# Patient Record
Sex: Male | Born: 1985 | Race: White | Hispanic: No | Marital: Single | Smoking: Never smoker
Health system: Southern US, Community
[De-identification: ages and names within clinical notes are randomized; demographics above are authoritative.]

## PROBLEM LIST (undated history)

## (undated) DIAGNOSIS — K5792 Diverticulitis of intestine, part unspecified, without perforation or abscess without bleeding: Secondary | ICD-10-CM

## (undated) DIAGNOSIS — T7840XA Allergy, unspecified, initial encounter: Secondary | ICD-10-CM

## (undated) DIAGNOSIS — H5200 Hypermetropia, unspecified eye: Secondary | ICD-10-CM

## (undated) HISTORY — DX: Diverticulitis of intestine, part unspecified, without perforation or abscess without bleeding: K57.92

## (undated) HISTORY — DX: Hypermetropia, unspecified eye: H52.00

## (undated) HISTORY — PX: WISDOM TOOTH EXTRACTION: SHX21

## (undated) HISTORY — DX: Allergy, unspecified, initial encounter: T78.40XA

## (undated) HISTORY — PX: APPENDECTOMY: SHX54

---

## 2009-09-10 ENCOUNTER — Ambulatory Visit: Payer: Self-pay | Admitting: Family Medicine

## 2010-11-14 ENCOUNTER — Encounter: Payer: Self-pay | Admitting: Medical

## 2010-11-14 ENCOUNTER — Ambulatory Visit (INDEPENDENT_AMBULATORY_CARE_PROVIDER_SITE_OTHER): Payer: 59 | Admitting: Medical

## 2010-11-14 ENCOUNTER — Encounter: Payer: Self-pay | Admitting: Family Medicine

## 2010-11-14 DIAGNOSIS — Z0000 Encounter for general adult medical examination without abnormal findings: Secondary | ICD-10-CM

## 2010-11-14 DIAGNOSIS — E663 Overweight: Secondary | ICD-10-CM

## 2010-11-14 DIAGNOSIS — R7301 Impaired fasting glucose: Secondary | ICD-10-CM

## 2010-11-14 LAB — POCT GLYCOSYLATED HEMOGLOBIN (HGB A1C): Hemoglobin A1C: 5.5

## 2010-11-14 LAB — POCT URINALYSIS DIPSTICK
Ketones, UA: NEGATIVE
Leukocytes, UA: NEGATIVE
Urobilinogen, UA: NEGATIVE
pH, UA: 5

## 2010-11-14 NOTE — Patient Instructions (Signed)
Preventative Care for Adults, Male       REGULAR HEALTH EXAMS:  A routine yearly physical is a good way to check in with your primary care provider about your health and preventive screening. It is also an opportunity to share updates about your health and any concerns you have, and receive a thorough all-over exam.   Most health insurance companies pay for at least some preventative services.  Check with your health plan for specific coverages.  WHAT PREVENTATIVE SERVICES DO MEN NEED?  Adult men should have their weight and blood pressure checked regularly.   Men age 35 and older should have their cholesterol levels checked regularly.  Beginning at age 50 and continuing to age 75, men should be screened for colorectal cancer.  Certain people should may need continued testing until age 85.  Other cancer screening may include exams for testicular and prostate cancer.  Updating vaccinations is part of preventative care.  Vaccinations help protect against diseases such as the flu.  Lab tests are generally done as part of preventative care to screen for anemia and blood disorders, to screen for problems with the kidneys and liver, to screen for bladder problems, to check blood sugar, and to check your cholesterol level.  Preventative services generally include counseling about diet, exercise, avoiding tobacco, drugs, excessive alcohol consumption, and sexually transmitted infections.    GENERAL RECOMMENDATIONS FOR GOOD HEALTH:  Healthy diet:  Eat a variety of foods, including fruit, vegetables, animal or vegetable protein, such as meat, fish, chicken, and eggs, or beans, lentils, tofu, and grains, such as rice.  Drink plenty of water daily.  Decrease saturated fat in the diet, avoid lots of red meat, processed foods, sweets, fast foods, and fried foods.  Exercise:  Aerobic exercise helps maintain good heart health. At least 30-40 minutes of moderate-intensity exercise is recommended.  For example, a brisk walk that increases your heart rate and breathing. This should be done on most days of the week.   Find a type of exercise or a variety of exercises that you enjoy so that it becomes a part of your daily life.  Examples are running, walking, swimming, water aerobics, and biking.  For motivation and support, explore group exercise such as aerobic class, spin class, Zumba, Yoga,or  martial arts, etc.    Set exercise goals for yourself, such as a certain weight goal, walk or run in a race such as a 5k walk/run.  Speak to your primary care provider about exercise goals.  Disease prevention:  If you smoke or chew tobacco, find out from your caregiver how to quit. It can literally save your life, no matter how long you have been a tobacco user. If you do not use tobacco, never begin.   Maintain a healthy diet and normal weight. Increased weight leads to problems with blood pressure and diabetes.   The Body Mass Index or BMI is a way of measuring how much of your body is fat. Having a BMI above 27 increases the risk of heart disease, diabetes, hypertension, stroke and other problems related to obesity. Your caregiver can help determine your BMI and based on it develop an exercise and dietary program to help you achieve or maintain this important measurement at a healthful level.  High blood pressure causes heart and blood vessel problems.  Persistent high blood pressure should be treated with medicine if weight loss and exercise do not work.   Fat and cholesterol leaves deposits in your arteries   that can block them. This causes heart disease and vessel disease elsewhere in your body.  If your cholesterol is found to be high, or if you have heart disease or certain other medical conditions, then you may need to have your cholesterol monitored frequently and be treated with medication.   Ask if you should have a stress test if your history suggests this. A stress test is a test done on  a treadmill that looks for heart disease. This test can find disease prior to there being a problem.  Avoid drinking alcohol in excess (more than two drinks per day).  Avoid use of street drugs. Do not share needles with anyone. Ask for professional help if you need assistance or instructions on stopping the use of alcohol, cigarettes, and/or drugs.  Brush your teeth twice a day with fluoride toothpaste, and floss once a day. Good oral hygiene prevents tooth decay and gum disease. The problems can be painful, unattractive, and can cause other health problems. Visit your dentist for a routine oral and dental check up and preventive care every 6-12 months.   Look at your skin regularly.  Use a mirror to look at your back. Notify your caregivers of changes in moles, especially if there are changes in shapes, colors, a size larger than a pencil eraser, an irregular border, or development of new moles.  Safety:  Use seatbelts 100% of the time, whether driving or as a passenger.  Use safety devices such as hearing protection if you work in environments with loud noise or significant background noise.  Use safety glasses when doing any work that could send debris in to the eyes.  Use a helmet if you ride a bike or motorcycle.  Use appropriate safety gear for contact sports.  Talk to your caregiver about gun safety.  Use sunscreen with a SPF (or skin protection factor) of 15 or greater.  Lighter skinned people are at a greater risk of skin cancer. Don't forget to also wear sunglasses in order to protect your eyes from too much damaging sunlight. Damaging sunlight can accelerate cataract formation.   Practice safe sex. Use condoms. Condoms are used for birth control and to help reduce the spread of sexually transmitted infections (or STIs).  Some of the STIs are gonorrhea (the clap), chlamydia, syphilis, trichomonas, herpes, HPV (human papilloma virus) and HIV (human immunodeficiency virus) which causes AIDS.  The herpes, HIV and HPV are viral illnesses that have no cure. These can result in disability, cancer and death.   Keep carbon monoxide and smoke detectors in your home functioning at all times. Change the batteries every 6 months or use a model that plugs into the wall.   Vaccinations:  Stay up to date with your tetanus shots and other required immunizations. You should have a booster for tetanus every 10 years. Be sure to get your flu shot every year, since 5%-20% of the [place] population comes down with the flu. The flu vaccine changes each year, so being vaccinated once is not enough. Get your shot in the fall, before the flu season peaks.   Other vaccines to consider:  Pneumococcal vaccine to protect against certain types of pneumonia.  This is normally recommended for adults age 65 or older.  However, adults younger than 26 years old with certain underlying conditions such as diabetes, heart or lung disease should also receive the vaccine.  Shingles vaccine to protect against Varicella Zoster if you are older than age 60, or younger   than 26 years old with certain underlying illness.  Hepatitis A vaccine to protect against a form of infection of the liver by a virus acquired from food.  Hepatitis B vaccine to protect against a form of infection of the liver by a virus acquired from blood or body fluids, particularly if you work in health care.  If you plan to travel internationally, check with your local health department for specific vaccination recommendations.  Cancer Screening:  Most routine colon cancer screening begins at the age of 50. On a yearly basis, doctors may provide special easy to use take-home tests to check for hidden blood in the stool. Sigmoidoscopy or colonoscopy can detect the earliest forms of colon cancer and is life saving. These tests use a small camera at the end of a tube to directly examine the colon. Speak to your caregiver about this at age 50, when routine  screening begins (and is repeated every 5 years unless early forms of pre-cancerous polyps or small growths are found).   At the age of 50 men usually start screening for prostate cancer every year. Screening may begin at a younger age for those with higher risk. Those at higher risk include African-Americans or having a family history of prostate cancer. There are two types of tests for prostate cancer:   Prostate-specific antigen (PSA) testing. Recent studies raise questions about prostate cancer using PSA and you should discuss this with your caregiver.   Digital rectal exam (in which your doctor's lubricated and gloved finger feels for enlargement of the prostate through the anus).   Screening for testicular cancer.  Do a monthly exam of your testicles. Gently roll each testicle between your thumb and fingers, feeling for any abnormal lumps. The best time to do this is after a hot shower or bath when the tissues are looser. Notify your caregivers of any lumps, tenderness or changes in size or shape immediately.     

## 2010-11-14 NOTE — Progress Notes (Signed)
Subjective:   HPI  Dean Carter is a 25 y.o. male who presents for a complete physical.  Been doing well without c/o.    Reviewed their medical, surgical, family, social, medication, and allergy history and updated chart as appropriate.  Past Medical History  Diagnosis Date  . Allergy     RHINITIS  . Farsightedness     wears contacts    Past Surgical History  Procedure Date  . Appendectomy   . Wisdom tooth extraction     Family History  Problem Relation Age of Onset  . Diabetes Mother     TYPE 2  . Heart disease Paternal Uncle   . Stroke Maternal Grandmother   . Heart disease Paternal Grandfather     CABG  . Cancer Neg Hx     History   Social History  . Marital Status: Single    Spouse Name: N/A    Number of Children: N/A  . Years of Education: N/A   Occupational History  . Not on file.   Social History Main Topics  . Smoking status: Never Smoker   . Smokeless tobacco: Never Used  . Alcohol Use: 1.0 oz/week    2 drink(s) per week  . Drug Use: No  . Sexually Active: Not on file     environmental air systems, single, exercise - softball, run, stationary bike   Other Topics Concern  . Not on file   Social History Narrative  . No narrative on file    No current outpatient prescriptions on file prior to visit.    No Known Allergies   Review of Systems Constitutional: denies fever, chills, sweats, unexpected weight change, anorexia, fatigue Allergy: negative; denies recent sneezing, itching, congestion Dermatology: denies changing moles, rash, lumps, new worrisome lesions ENT: no runny nose, ear pain, sore throat, hoarseness, sinus pain, teeth pain, hearing loss, epistaxis Cardiology: denies chest pain, palpitations, edema, orthopnea, paroxysmal nocturnal dyspnea Respiratory: denies cough, shortness of breath, dyspnea on exertion, wheezing, hemoptysis Gastroenterology: denies abdominal pain, nausea, vomiting, diarrhea, constipation, blood in stool,  changes in bowel movement, dysphagia Hematology: denies bleeding or bruising problems Musculoskeletal: denies arthralgias, myalgias, joint swelling, back pain, neck pain, cramping, gait changes Ophthalmology: denies vision changes, eye redness, itching, discharge Urology: denies dysuria, difficulty urinating, hematuria, urinary frequency, urgency, incontinence Neurology: no headache, weakness, tingling, numbness, dizziness Psychology: denies depressed mood, agitation, sleep problems     Objective:   Physical Exam  Filed Vitals:   11/14/10 1539  BP: 124/88  Pulse: 72  Temp: 97.7 F (36.5 C)  Resp: 16    General appearance: alert, no distress, WD/WN, white male, overweight Skin: unremarkable, few scattered benign appearing macules HEENT: normocephalic, conjunctiva/corneas normal, sclerae anicteric, PERRLA, EOMi, nares patent, no discharge or erythema, pharynx normal Oral cavity: MMM, tongue normal, teeth normal Neck: supple, no lymphadenopathy, no thyromegaly, no masses, normal ROM, no bruits Chest: non tender, normal shape and expansion Heart: RRR, normal S1, S2, no murmurs Lungs: CTA bilaterally, no wheezes, rhonchi, or rales Abdomen: +bs, soft, non tender, non distended, no masses, no hepatomegaly, no splenomegaly, no bruits Back: non tender, normal ROM, no scoliosis Musculoskeletal: upper extremities non tender, no obvious deformity, normal ROM throughout, lower extremities non tender, no obvious deformity, normal ROM throughout Extremities: no edema, no cyanosis, no clubbing Pulses: 2+ symmetric, upper and lower extremities, normal cap refill Neurological: alert, oriented x 3, CN2-12 intact, strength normal upper extremities and lower extremities, sensation normal throughout, DTRs 2+ throughout, no cerebellar signs,  gait normal Psychiatric: normal affect, behavior normal, pleasant  GU: normal external male genitalia, circumcised, no nodules, mass, hernias  Assessment :      Encounter Diagnoses  Name Primary?  . General medical examination Yes  . Impaired fasting blood sugar   . Overweight      Plan:    Physical exam - discussed healthy lifestyle, diet, exercise, preventative care, vaccinations, and addressed their concerns.  Decline flu shot today.  He will return this week for fasting labs.   Impaired fasting glucose - glucose elevated last visit 7/11.  HgbA1C today  5.5%.     Overweight - discussed need for diet and exercise changes, weight loss.

## 2010-11-17 ENCOUNTER — Other Ambulatory Visit: Payer: 59

## 2010-11-17 LAB — COMPREHENSIVE METABOLIC PANEL
AST: 32 U/L (ref 0–37)
Albumin: 4.6 g/dL (ref 3.5–5.2)
Alkaline Phosphatase: 63 U/L (ref 39–117)
Potassium: 4.4 mEq/L (ref 3.5–5.3)
Sodium: 138 mEq/L (ref 135–145)
Total Bilirubin: 1.2 mg/dL (ref 0.3–1.2)
Total Protein: 6.7 g/dL (ref 6.0–8.3)

## 2010-11-17 LAB — CBC WITH DIFFERENTIAL/PLATELET
Basophils Relative: 1 % (ref 0–1)
Eosinophils Absolute: 0.2 10*3/uL (ref 0.0–0.7)
Eosinophils Relative: 2 % (ref 0–5)
Lymphs Abs: 1.7 10*3/uL (ref 0.7–4.0)
MCH: 29.5 pg (ref 26.0–34.0)
MCHC: 33.7 g/dL (ref 30.0–36.0)
MCV: 87.5 fL (ref 78.0–100.0)
Monocytes Relative: 10 % (ref 3–12)
Neutrophils Relative %: 62 % (ref 43–77)
Platelets: 258 10*3/uL (ref 150–400)
RBC: 5.36 MIL/uL (ref 4.22–5.81)

## 2010-11-17 LAB — LIPID PANEL
HDL: 41 mg/dL (ref >39)
LDL Cholesterol: 114 mg/dL — ABNORMAL HIGH (ref 0–99)
Total CHOL/HDL Ratio: 4.3 Ratio

## 2010-11-17 LAB — TSH: TSH: 1.165 u[IU]/mL (ref 0.350–4.500)

## 2010-11-18 ENCOUNTER — Telehealth: Payer: Self-pay | Admitting: *Deleted

## 2010-11-18 NOTE — Telephone Encounter (Addendum)
Message copied by Dorthula Perfect on Fri Nov 18, 2010 11:38 AM ------      Message from: Aleen Campi, DAVID S      Created: Fri Nov 18, 2010  7:52 AM       1 of his liver tests was slightly elevated, but ALL other tests were normal - normal blood count, kidney, lytes, thyroid, urine, and cholesterol.   Regarding the liver test, it is likely due to fatty liver which is usually related to diet, being overweight, and alcohol intake.  Recommend no more than 2 alcoholic drinks on a given day, and primarily make sure he is exercising most days of the week, trying to eat a healthy low fat diet.             Lets have him come in for nurse visit for weight check and liver panel in 56mo.   Pt notified of lab results.  Advised pt regarding diet, exercise and alcohol intake.  Pt scheduled to return on 03-18-10 at 8:15am.  CM, LPN

## 2010-12-21 ENCOUNTER — Ambulatory Visit (INDEPENDENT_AMBULATORY_CARE_PROVIDER_SITE_OTHER): Payer: 59 | Admitting: Medical

## 2010-12-21 ENCOUNTER — Encounter: Payer: Self-pay | Admitting: Medical

## 2010-12-21 VITALS — BP 120/88 | HR 80 | Temp 98.0°F | Resp 20 | Ht 73.0 in | Wt 259.0 lb

## 2010-12-21 DIAGNOSIS — H8309 Labyrinthitis, unspecified ear: Secondary | ICD-10-CM | POA: Insufficient documentation

## 2010-12-21 DIAGNOSIS — J069 Acute upper respiratory infection, unspecified: Secondary | ICD-10-CM

## 2010-12-21 NOTE — Patient Instructions (Signed)
Labyrinthitis (Inner Ear Inflammation) Your exam shows you have an inner ear disturbance or labyrinthitis. The cause of this condition is not known. But it may be due to a virus infection. The symptoms of labyrinthitis include vertigo or dizziness made worse by motion, nausea and vomiting. The onset of labyrinthitis may be very sudden. It usually lasts for a few days and then clears up over 1-2 weeks. The treatment of an inner ear disturbance includes bed rest and medications to reduce dizziness, nausea, and vomiting. You should stay away from alcohol, tranquilizers, caffeine, nicotine, or any medicine your doctor thinks may make your symptoms worse. Further testing may be needed to evaluate your hearing and balance system.  Please see your doctor or go to the emergency room right away if you have:  Increasing vertigo, earache, loss of hearing, or ear drainage.   Headache, blurred vision, trouble walking, fainting, or fever.   Persistent vomiting, dehydration, or extreme weakness.  Document Released: 02/20/2005 Document Re-Released: 12/24/2007 [facility] Patient Information 2011 [place], [place].

## 2010-12-21 NOTE — Progress Notes (Signed)
  Subjective:     Dean Carter is a 25 y.o. male who presents for evaluation of symptoms of a URI. Symptoms include nasal congestion, sore throat and vertigo. Onset of symptoms was 1 day ago, and has been gradually worsening since that time.  Yesterday had sore throat, but over night started getting fatigue, dizziness.  He notes similar prior history in college and he ultimately had ear infection at that time.  Treatment to date: began OTC Alka Seltzer Cough and Cold.  Denies sick contacts.  No other aggravating or relieving factors.  No other c/o.  The following portions of the patient's history were reviewed and updated as appropriate: allergies, current medications, past family history, past medical history, past social history, past surgical history and problem list.  Review of Systems Constitutional:  denies fever, chills, sweats, anorexia Skin: denies rash HEENT: +sore throat; denies ear pain, itchy watery eyes Cardiovascular: denies chest pain Lungs: denies wheezing, SOB Abdomen: +nausea; denies abdominal pain, vomiting, diarrhea GU: denies dysuria  Objective:   Filed Vitals:   12/21/10 0951  BP: 120/88  Pulse: 80  Temp: 98 F (36.7 C)  Resp: 20    General appearance: Alert, WD/WN, no distress, mildly ill appearing                             Skin: warm, no rash                           Head: no sinus tenderness                            Eyes: conjunctiva normal, corneas clear, PERRLA                            Ears: pearly TMs, external ear canals normal                          Nose: septum midline,right turbinate swollen, with erythema and clear discharge, left turbinate normal             Mouth/throat: MMM, tongue normal, mild pharyngeal erythema                           Neck: supple, no adenopathy, no thyromegaly, nontender                          Heart: RRR, normal S1, S2, no murmurs                         Lungs: CTA bilaterally, no wheezes, rales, or rhonchi    Assessment:     Encounter Diagnoses  Name Primary?  . Labyrinthitis, viral Yes  . URI (upper respiratory infection)      Plan:   Discussed diagnosis and treatment of URI/labyrinthitis.  Suggested symptomatic OTC remedies, c/t Alka Seltzer cold and cough. Nasal saline spray for congestion.  Tylenol or Ibuprofen OTC for fever and malaise.  Call/return in 2-3 days if symptoms aren't resolving.

## 2011-03-17 ENCOUNTER — Other Ambulatory Visit: Payer: 59

## 2011-03-17 VITALS — Wt 241.0 lb

## 2011-03-17 DIAGNOSIS — R748 Abnormal levels of other serum enzymes: Secondary | ICD-10-CM

## 2011-03-18 LAB — HEPATIC FUNCTION PANEL
Albumin: 5.1 g/dL (ref 3.5–5.2)
Bilirubin, Direct: 0.3 mg/dL (ref 0.0–0.3)
Total Bilirubin: 1.1 mg/dL (ref 0.3–1.2)

## 2011-09-27 ENCOUNTER — Ambulatory Visit (INDEPENDENT_AMBULATORY_CARE_PROVIDER_SITE_OTHER): Payer: 59 | Admitting: Medical

## 2011-09-27 ENCOUNTER — Encounter: Payer: Self-pay | Admitting: Medical

## 2011-09-27 VITALS — BP 110/70 | HR 80 | Temp 97.5°F | Resp 18 | Ht 74.0 in | Wt 253.0 lb

## 2011-09-27 DIAGNOSIS — E663 Overweight: Secondary | ICD-10-CM

## 2011-09-27 DIAGNOSIS — Z113 Encounter for screening for infections with a predominantly sexual mode of transmission: Secondary | ICD-10-CM

## 2011-09-27 DIAGNOSIS — Z0000 Encounter for general adult medical examination without abnormal findings: Secondary | ICD-10-CM

## 2011-09-27 LAB — COMPREHENSIVE METABOLIC PANEL
ALT: 52 U/L (ref 0–53)
Albumin: 4.6 g/dL (ref 3.5–5.2)
CO2: 29 mEq/L (ref 19–32)
Calcium: 9.4 mg/dL (ref 8.4–10.5)
Chloride: 102 mEq/L (ref 96–112)
Glucose, Bld: 79 mg/dL (ref 70–99)
Potassium: 4.4 mEq/L (ref 3.5–5.3)
Sodium: 140 mEq/L (ref 135–145)
Total Protein: 6.7 g/dL (ref 6.0–8.3)

## 2011-09-27 LAB — CBC
MCV: 88.1 fL (ref 78.0–100.0)
Platelets: 272 10*3/uL (ref 150–400)
RBC: 5.3 MIL/uL (ref 4.22–5.81)
RDW: 13.9 % (ref 11.5–15.5)
WBC: 8 10*3/uL (ref 4.0–10.5)

## 2011-09-27 LAB — LIPID PANEL
Cholesterol: 171 mg/dL (ref 0–200)
HDL: 45 mg/dL (ref >39)
Triglycerides: 111 mg/dL (ref <150)

## 2011-09-27 LAB — HEMOGLOBIN A1C: Hgb A1c MFr Bld: 5.5 % (ref <5.7)

## 2011-09-27 NOTE — Patient Instructions (Signed)
Preventative Care for Adults, Male       REGULAR HEALTH EXAMS:  A routine yearly physical is a good way to check in with your primary care provider about your health and preventive screening. It is also an opportunity to share updates about your health and any concerns you have, and receive a thorough all-over exam.   Most health insurance companies pay for at least some preventative services.  Check with your health plan for specific coverages.  WHAT PREVENTATIVE SERVICES DO MEN NEED?  Adult men should have their weight and blood pressure checked regularly.   Men age 35 and older should have their cholesterol levels checked regularly.  Beginning at age 50 and continuing to age 75, men should be screened for colorectal cancer.  Certain people should may need continued testing until age 85.  Other cancer screening may include exams for testicular and prostate cancer.  Updating vaccinations is part of preventative care.  Vaccinations help protect against diseases such as the flu.  Lab tests are generally done as part of preventative care to screen for anemia and blood disorders, to screen for problems with the kidneys and liver, to screen for bladder problems, to check blood sugar, and to check your cholesterol level.  Preventative services generally include counseling about diet, exercise, avoiding tobacco, drugs, excessive alcohol consumption, and sexually transmitted infections.    GENERAL RECOMMENDATIONS FOR GOOD HEALTH:  Healthy diet:  Eat a variety of foods, including fruit, vegetables, animal or vegetable protein, such as meat, fish, chicken, and eggs, or beans, lentils, tofu, and grains, such as rice.  Drink plenty of water daily.  Decrease saturated fat in the diet, avoid lots of red meat, processed foods, sweets, fast foods, and fried foods.  Exercise:  Aerobic exercise helps maintain good heart health. At least 30-40 minutes of moderate-intensity exercise is recommended.  For example, a brisk walk that increases your heart rate and breathing. This should be done on most days of the week.   Find a type of exercise or a variety of exercises that you enjoy so that it becomes a part of your daily life.  Examples are running, walking, swimming, water aerobics, and biking.  For motivation and support, explore group exercise such as aerobic class, spin class, Zumba, Yoga,or  martial arts, etc.    Set exercise goals for yourself, such as a certain weight goal, walk or run in a race such as a 5k walk/run.  Speak to your primary care provider about exercise goals.  Disease prevention:  If you smoke or chew tobacco, find out from your caregiver how to quit. It can literally save your life, no matter how long you have been a tobacco user. If you do not use tobacco, never begin.   Maintain a healthy diet and normal weight. Increased weight leads to problems with blood pressure and diabetes.   The Body Mass Index or BMI is a way of measuring how much of your body is fat. Having a BMI above 27 increases the risk of heart disease, diabetes, hypertension, stroke and other problems related to obesity. Your caregiver can help determine your BMI and based on it develop an exercise and dietary program to help you achieve or maintain this important measurement at a healthful level.  High blood pressure causes heart and blood vessel problems.  Persistent high blood pressure should be treated with medicine if weight loss and exercise do not work.   Fat and cholesterol leaves deposits in your arteries   that can block them. This causes heart disease and vessel disease elsewhere in your body.  If your cholesterol is found to be high, or if you have heart disease or certain other medical conditions, then you may need to have your cholesterol monitored frequently and be treated with medication.   Ask if you should have a stress test if your history suggests this. A stress test is a test done on  a treadmill that looks for heart disease. This test can find disease prior to there being a problem.  Avoid drinking alcohol in excess (more than two drinks per day).  Avoid use of street drugs. Do not share needles with anyone. Ask for professional help if you need assistance or instructions on stopping the use of alcohol, cigarettes, and/or drugs.  Brush your teeth twice a day with fluoride toothpaste, and floss once a day. Good oral hygiene prevents tooth decay and gum disease. The problems can be painful, unattractive, and can cause other health problems. Visit your dentist for a routine oral and dental check up and preventive care every 6-12 months.   Look at your skin regularly.  Use a mirror to look at your back. Notify your caregivers of changes in moles, especially if there are changes in shapes, colors, a size larger than a pencil eraser, an irregular border, or development of new moles.  Safety:  Use seatbelts 100% of the time, whether driving or as a passenger.  Use safety devices such as hearing protection if you work in environments with loud noise or significant background noise.  Use safety glasses when doing any work that could send debris in to the eyes.  Use a helmet if you ride a bike or motorcycle.  Use appropriate safety gear for contact sports.  Talk to your caregiver about gun safety.  Use sunscreen with a SPF (or skin protection factor) of 15 or greater.  Lighter skinned people are at a greater risk of skin cancer. Don't forget to also wear sunglasses in order to protect your eyes from too much damaging sunlight. Damaging sunlight can accelerate cataract formation.   Practice safe sex. Use condoms. Condoms are used for birth control and to help reduce the spread of sexually transmitted infections (or STIs).  Some of the STIs are gonorrhea (the clap), chlamydia, syphilis, trichomonas, herpes, HPV (human papilloma virus) and HIV (human immunodeficiency virus) which causes AIDS.  The herpes, HIV and HPV are viral illnesses that have no cure. These can result in disability, cancer and death.   Keep carbon monoxide and smoke detectors in your home functioning at all times. Change the batteries every 6 months or use a model that plugs into the wall.   Vaccinations:  Stay up to date with your tetanus shots and other required immunizations. You should have a booster for tetanus every 10 years. Be sure to get your flu shot every year, since 5%-20% of the [place] population comes down with the flu. The flu vaccine changes each year, so being vaccinated once is not enough. Get your shot in the fall, before the flu season peaks.   Other vaccines to consider:  Pneumococcal vaccine to protect against certain types of pneumonia.  This is normally recommended for adults age 65 or older.  However, adults younger than 27 years old with certain underlying conditions such as diabetes, heart or lung disease should also receive the vaccine.  Shingles vaccine to protect against Varicella Zoster if you are older than age 60, or younger   than 27 years old with certain underlying illness.  Hepatitis A vaccine to protect against a form of infection of the liver by a virus acquired from food.  Hepatitis B vaccine to protect against a form of infection of the liver by a virus acquired from blood or body fluids, particularly if you work in health care.  If you plan to travel internationally, check with your local health department for specific vaccination recommendations.  Cancer Screening:  Most routine colon cancer screening begins at the age of 50. On a yearly basis, doctors may provide special easy to use take-home tests to check for hidden blood in the stool. Sigmoidoscopy or colonoscopy can detect the earliest forms of colon cancer and is life saving. These tests use a small camera at the end of a tube to directly examine the colon. Speak to your caregiver about this at age 50, when routine  screening begins (and is repeated every 5 years unless early forms of pre-cancerous polyps or small growths are found).   At the age of 50 men usually start screening for prostate cancer every year. Screening may begin at a younger age for those with higher risk. Those at higher risk include African-Americans or having a family history of prostate cancer. There are two types of tests for prostate cancer:   Prostate-specific antigen (PSA) testing. Recent studies raise questions about prostate cancer using PSA and you should discuss this with your caregiver.   Digital rectal exam (in which your doctor's lubricated and gloved finger feels for enlargement of the prostate through the anus).   Screening for testicular cancer.  Do a monthly exam of your testicles. Gently roll each testicle between your thumb and fingers, feeling for any abnormal lumps. The best time to do this is after a hot shower or bath when the tissues are looser. Notify your caregivers of any lumps, tenderness or changes in size or shape immediately.     

## 2011-09-27 NOTE — Progress Notes (Signed)
Subjective:   HPI  Dean Carter is a 26 y.o. male who presents for a complete physical.  Been doing well without c/o.  Leaving to go to [place] soon on assignment.  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   Reviewed their medical, surgical, family, social, medication, and allergy history and updated chart as appropriate.  Past Medical History  Diagnosis Date  . Allergy     RHINITIS  . Farsightedness     wears contacts    Past Surgical History  Procedure Date  . Appendectomy   . Wisdom tooth extraction     Family History  Problem Relation Age of Onset  . Diabetes Mother     TYPE 2  . Heart disease Paternal Uncle     CABG  . Stroke Maternal Grandmother   . Heart disease Paternal Grandfather     CABG  . Cancer Neg Hx   . Hypertension Neg Hx   . Hyperlipidemia Neg Hx     History   Social History  . Marital Status: Single    Spouse Name: N/A    Number of Children: N/A  . Years of Education: N/A   Occupational History  . Emergency planning/management officer - Scientist, research (life sciences)   Social History Main Topics  . Smoking status: Never Smoker   . Smokeless tobacco: Never Used  . Alcohol Use: 1.0 oz/week    2 drink(s) per week  . Drug Use: No  . Sexually Active: Not on file     environmental air systems, single, exercise - softball, run, stationary bike   Other Topics Concern  . Not on file   Social History Narrative   Single, no children, exercise - tennis, softball, gym with spin    Current Outpatient Prescriptions on File Prior to Visit  Medication Sig Dispense Refill  . Ascorbic Acid (VITAMIN C) 1000 MG tablet Take 1,000 mg by mouth daily.        . fish oil-omega-3 fatty acids 1000 MG capsule Take 1 g by mouth daily.        Marland Kitchen loratadine (CLARITIN) 10 MG tablet Take 10 mg by mouth daily.      . vitamin B-12 (CYANOCOBALAMIN) 1000 MCG tablet Take 1,000 mcg by mouth daily.           No Known Allergies   Review of Systems Constitutional: denies fever, chills, sweats, unexpected weight change, anorexia, fatigue Allergy: negative; denies recent sneezing, itching, congestion Dermatology: denies changing moles, rash, lumps, new worrisome lesions ENT: no runny nose, ear pain, sore throat, hoarseness, sinus pain, teeth pain, hearing loss, epistaxis Cardiology: denies chest pain, palpitations, edema, orthopnea, paroxysmal nocturnal dyspnea Respiratory: denies cough, shortness of breath, dyspnea on exertion, wheezing, hemoptysis Gastroenterology: denies abdominal pain, nausea, vomiting, diarrhea, constipation, blood in stool, changes in bowel movement, dysphagia Hematology: denies bleeding or bruising problems Musculoskeletal: denies arthralgias, myalgias, joint swelling, back pain, neck pain, cramping, gait changes Ophthalmology: denies vision changes, eye redness, itching, discharge Urology: denies dysuria, difficulty urinating, hematuria, urinary frequency, urgency, incontinence Neurology: no headache, weakness, tingling, numbness, dizziness Psychology: denies depressed mood, agitation, sleep problems     Objective:   Physical Exam  Filed Vitals:   09/27/11 0856  BP: 110/70  Pulse: 80  Temp: 97.5 F (36.4 C)  Resp: 18    General appearance: alert, no distress, WD/WN, white male, overweight Skin: unremarkable, few scattered benign appearing macules HEENT: normocephalic, conjunctiva/corneas normal, sclerae  anicteric, PERRLA, EOMi, nares patent, no discharge or erythema, pharynx normal Oral cavity: MMM, tongue normal, teeth normal Neck: supple, no lymphadenopathy, no thyromegaly, no masses, normal ROM, no bruits Chest: non tender, normal shape and expansion Heart: RRR, normal S1, S2, no murmurs Lungs: CTA bilaterally, no wheezes, rhonchi, or rales Abdomen: +bs, soft, non tender, non distended, no masses, no hepatomegaly, no splenomegaly, no bruits Back: non  tender, normal ROM, no scoliosis Musculoskeletal: upper extremities non tender, no obvious deformity, normal ROM throughout, lower extremities non tender, no obvious deformity, normal ROM throughout Extremities: no edema, no cyanosis, no clubbing Pulses: 2+ symmetric, upper and lower extremities, normal cap refill Neurological: alert, oriented x 3, CN2-12 intact, strength normal upper extremities and lower extremities, sensation normal throughout, DTRs 2+ throughout, no cerebellar signs, gait normal Psychiatric: normal affect, behavior normal, pleasant  GU: normal external male genitalia, circumcised, no nodules, mass, hernias  Assessment :    Encounter Diagnoses  Name Primary?  . Routine general medical examination at a health care facility Yes  . Screen for STD (sexually transmitted disease)   . Overweight      Plan:    Physical exam - discussed healthy lifestyle, diet, exercise, preventative care, vaccinations, and addressed their concerns.  He will get copy of prior vaccines through college.   STD screening today, discussed safe sex.    Overweight - discussed need for diet and exercise changes, weight loss.

## 2011-09-28 LAB — RPR

## 2011-09-28 LAB — GC/CHLAMYDIA PROBE AMP, URINE: Chlamydia, Swab/Urine, PCR: NEGATIVE

## 2012-03-06 DIAGNOSIS — K5792 Diverticulitis of intestine, part unspecified, without perforation or abscess without bleeding: Secondary | ICD-10-CM

## 2012-03-06 HISTORY — DX: Diverticulitis of intestine, part unspecified, without perforation or abscess without bleeding: K57.92

## 2012-10-09 ENCOUNTER — Encounter: Payer: Self-pay | Admitting: Medical

## 2012-10-09 ENCOUNTER — Ambulatory Visit (INDEPENDENT_AMBULATORY_CARE_PROVIDER_SITE_OTHER): Payer: 59 | Admitting: Medical

## 2012-10-09 VITALS — BP 100/78 | HR 80 | Temp 97.4°F | Resp 16 | Wt 261.0 lb

## 2012-10-09 DIAGNOSIS — Z2089 Contact with and (suspected) exposure to other communicable diseases: Secondary | ICD-10-CM

## 2012-10-09 DIAGNOSIS — Z202 Contact with and (suspected) exposure to infections with a predominantly sexual mode of transmission: Secondary | ICD-10-CM

## 2012-10-09 DIAGNOSIS — Z113 Encounter for screening for infections with a predominantly sexual mode of transmission: Secondary | ICD-10-CM

## 2012-10-09 DIAGNOSIS — J069 Acute upper respiratory infection, unspecified: Secondary | ICD-10-CM

## 2012-10-09 NOTE — Patient Instructions (Signed)
Sexually Transmitted Disease  Sexually transmitted disease (STD) refers to any infection that is passed from person to person during sexual activity. This may happen by way of saliva, semen, blood, vaginal mucus, or urine. Common STDs include:   Gonorrhea.   Chlamydia.   Syphilis.   HIV/AIDS.   Genital herpes.   Hepatitis B and C.   Trichomonas.   Human papillomavirus (HPV).   Pubic lice.  CAUSES   An STD may be spread by bacteria, virus, or parasite. A person can get an STD by:   Sexual intercourse with an infected person.   Sharing sex toys with an infected person.   Sharing needles with an infected person.   Having intimate contact with the genitals, mouth, or rectal areas of an infected person.  SYMPTOMS   Some people may not have any symptoms, but they can still pass the infection to others. Different STDs have different symptoms. Symptoms include:   Painful or bloody urination.   Pain in the pelvis, abdomen, vagina, anus, throat, or eyes.   Skin rash, itching, irritation, growths, or sores (lesions). These usually occur in the genital or anal area.   Abnormal vaginal discharge.   Penile discharge in men.   Soft, flesh-colored skin growths in the genital or anal area.   Fever.   Pain or bleeding during sexual intercourse.   Swollen glands in the groin area.   Yellow skin and eyes (jaundice). This is seen with hepatitis.  DIAGNOSIS   To make a diagnosis, your caregiver may:   Take a medical history.   Perform a physical exam.   Take a specimen (culture) to be examined.   Examine a sample of discharge under a microscope.   Perform blood tests.   Perform a Pap test, if this applies.   Perform a colposcopy.   Perform a laparoscopy.  TREATMENT    Chlamydia, gonorrhea, trichomonas, and syphilis can be cured with antibiotic medicine.   Genital herpes, hepatitis, and HIV can be treated, but not cured, with prescribed medicines. The medicines will lessen the symptoms.   Genital warts  from HPV can be treated with medicine or by freezing, burning (electrocautery), or surgery. Warts may come back.   HPV is a virus and cannot be cured with medicine or surgery.However, abnormal areas may be followed very closely by your caregiver and may be removed from the cervix, vagina, or vulva through office procedures or surgery.  If your diagnosis is confirmed, your recent sexual partners need treatment. This is true even if they are symptom-free or have a negative culture or evaluation. They should not have sex until their caregiver says it is okay.  HOME CARE INSTRUCTIONS   All sexual partners should be informed, tested, and treated for all STDs.   Take your antibiotics as directed. Finish them even if you start to feel better.   Only take over-the-counter or prescription medicines for pain, discomfort, or fever as directed by your caregiver.   Rest.   Eat a balanced diet and drink enough fluids to keep your urine clear or pale yellow.   Do not have sex until treatment is completed and you have followed up with your caregiver. STDs should be checked after treatment.   Keep all follow-up appointments, Pap tests, and blood tests as directed by your caregiver.   Only use latex condoms and water-soluble lubricants during sexual activity. Do not use petroleum jelly or oils.   Avoid alcohol and illegal drugs.   Get vaccinated   for HPV and hepatitis. If you have not received these vaccines in the past, talk to your caregiver about whether one or both might be right for you.   Avoid risky sex practices that can break the skin.  The only way to avoid getting an STD is to avoid all sexual activity.Latex condoms and dental dams (for oral sex) will help lessen the risk of getting an STD, but will not completely eliminate the risk.  SEEK MEDICAL CARE IF:    You have a fever.   You have any new or worsening symptoms.  Document Released: 05/13/2002 Document Revised: 05/15/2011 Document Reviewed:  05/20/2010  [employer] Patient Information 2014 [employer]

## 2012-10-09 NOTE — Progress Notes (Signed)
Subjective: Here for concern for STD screening.  Single.  In the past year since last STD check has had 3 sexual partners.   Using condoms some.   He denies any symptoms.  Denies hx/o IV drugs use, no hx/o blood transfusion.   Has some sinus congestion, nose stopped up an runny, some throat congestion, some cough.   Started Monday.   Not using anything for the symptoms.  Usually uses Mucinex.    No other c/o.    ROS as in subjective  Objective: Filed Vitals:   10/09/12 0812  BP: 100/78  Pulse: 80  Temp: 97.4 F (36.3 C)  Resp: 16    General appearance: alert, no distress, WD/WN HEENT: normocephalic, sclerae anicteric, TMs pearly, nares patent, no discharge or erythema, pharynx normal Oral cavity: MMM, no lesions Neck: supple, no lymphadenopathy, no thyromegaly, no masses Heart: RRR, normal S1, S2, no murmurs Lungs: CTA bilaterally, no wheezes, rhonchi, or rales GU: normal male external genitalia, circumcised, no lesions, no rash, no mass, no lymphadenopathy   Assessment: Encounter Diagnoses  Name Primary?  Marland Kitchen Upper respiratory infection Yes  . Screen for sexually transmitted diseases   . Venereal disease contact      Plan: Discussed supportive care for URI, call or return if worse or not improving.   STD screening today, discussed safe sex, prevention.   Handout given.  Follow-up pending labs.

## 2012-10-10 LAB — HEPATITIS C ANTIBODY: HCV Ab: NEGATIVE

## 2012-10-10 LAB — RPR

## 2012-10-10 LAB — HEPATITIS B CORE ANTIBODY, IGM: Hep B C IgM: NEGATIVE

## 2012-10-10 LAB — HEPATITIS B SURFACE ANTIGEN: Hepatitis B Surface Ag: NEGATIVE

## 2012-10-24 ENCOUNTER — Telehealth: Payer: Self-pay | Admitting: Medical

## 2012-10-24 NOTE — Telephone Encounter (Signed)
There is no record of his immunizations in the chart so we need to get his immunization record and see if he has had a TDaP within 10 years. If not then he can come in sooner for that

## 2012-10-25 NOTE — Telephone Encounter (Signed)
Pt said he was going to call at the begin of week to schedule appointment for nurse visit for t-dap

## 2012-11-01 ENCOUNTER — Other Ambulatory Visit (INDEPENDENT_AMBULATORY_CARE_PROVIDER_SITE_OTHER): Payer: 59

## 2012-11-01 DIAGNOSIS — Z23 Encounter for immunization: Secondary | ICD-10-CM

## 2012-11-08 ENCOUNTER — Encounter: Payer: Self-pay | Admitting: Medical

## 2012-12-06 ENCOUNTER — Encounter: Payer: Self-pay | Admitting: Medical

## 2012-12-06 ENCOUNTER — Ambulatory Visit (INDEPENDENT_AMBULATORY_CARE_PROVIDER_SITE_OTHER): Payer: 59 | Admitting: Medical

## 2012-12-06 VITALS — BP 102/78 | HR 72 | Temp 98.0°F | Resp 16 | Ht 73.5 in | Wt 242.0 lb

## 2012-12-06 DIAGNOSIS — Z0000 Encounter for general adult medical examination without abnormal findings: Secondary | ICD-10-CM

## 2012-12-06 DIAGNOSIS — E669 Obesity, unspecified: Secondary | ICD-10-CM

## 2012-12-06 DIAGNOSIS — Z833 Family history of diabetes mellitus: Secondary | ICD-10-CM

## 2012-12-06 DIAGNOSIS — Z23 Encounter for immunization: Secondary | ICD-10-CM

## 2012-12-06 LAB — POCT URINALYSIS DIPSTICK
Blood, UA: NEGATIVE
Protein, UA: NEGATIVE
Spec Grav, UA: 1.02
Urobilinogen, UA: NEGATIVE

## 2012-12-06 LAB — COMPREHENSIVE METABOLIC PANEL
ALT: 22 U/L (ref 0–53)
AST: 22 U/L (ref 0–37)
Albumin: 4.6 g/dL (ref 3.5–5.2)
Calcium: 9.8 mg/dL (ref 8.4–10.5)
Chloride: 101 mEq/L (ref 96–112)
Potassium: 3.9 mEq/L (ref 3.5–5.3)

## 2012-12-06 LAB — LIPID PANEL
LDL Cholesterol: 82 mg/dL (ref 0–99)
VLDL: 12 mg/dL (ref 0–40)

## 2012-12-06 LAB — CBC
HCT: 46.1 % (ref 39.0–52.0)
MCHC: 34.5 g/dL (ref 30.0–36.0)
RDW: 13.8 % (ref 11.5–15.5)

## 2012-12-06 LAB — HEMOGLOBIN A1C: Mean Plasma Glucose: 108 mg/dL (ref <117)

## 2012-12-06 NOTE — Patient Instructions (Signed)
Preventative Care for Adults, Male       REGULAR HEALTH EXAMS:  A routine yearly physical is a good way to check in with your primary care provider about your health and preventive screening. It is also an opportunity to share updates about your health and any concerns you have, and receive a thorough all-over exam.   Most health insurance companies pay for at least some preventative services.  Check with your health plan for specific coverages.  WHAT PREVENTATIVE SERVICES DO MEN NEED?  Adult men should have their weight and blood pressure checked regularly.   Men age 35 and older should have their cholesterol levels checked regularly.  Beginning at age 50 and continuing to age 75, men should be screened for colorectal cancer.  Certain people should may need continued testing until age 85.  Other cancer screening may include exams for testicular and prostate cancer.  Updating vaccinations is part of preventative care.  Vaccinations help protect against diseases such as the flu.  Lab tests are generally done as part of preventative care to screen for anemia and blood disorders, to screen for problems with the kidneys and liver, to screen for bladder problems, to check blood sugar, and to check your cholesterol level.  Preventative services generally include counseling about diet, exercise, avoiding tobacco, drugs, excessive alcohol consumption, and sexually transmitted infections.    GENERAL RECOMMENDATIONS FOR GOOD HEALTH:  Healthy diet:  Eat a variety of foods, including fruit, vegetables, animal or vegetable protein, such as meat, fish, chicken, and eggs, or beans, lentils, tofu, and grains, such as rice.  Drink plenty of water daily.  Decrease saturated fat in the diet, avoid lots of red meat, processed foods, sweets, fast foods, and fried foods.  Exercise:  Aerobic exercise helps maintain good heart health. At least 30-40 minutes of moderate-intensity exercise is recommended.  For example, a brisk walk that increases your heart rate and breathing. This should be done on most days of the week.   Find a type of exercise or a variety of exercises that you enjoy so that it becomes a part of your daily life.  Examples are running, walking, swimming, water aerobics, and biking.  For motivation and support, explore group exercise such as aerobic class, spin class, Zumba, Yoga,or  martial arts, etc.    Set exercise goals for yourself, such as a certain weight goal, walk or run in a race such as a 5k walk/run.  Speak to your primary care provider about exercise goals.  Disease prevention:  If you smoke or chew tobacco, find out from your caregiver how to quit. It can literally save your life, no matter how long you have been a tobacco user. If you do not use tobacco, never begin.   Maintain a healthy diet and normal weight. Increased weight leads to problems with blood pressure and diabetes.   The Body Mass Index or BMI is a way of measuring how much of your body is fat. Having a BMI above 27 increases the risk of heart disease, diabetes, hypertension, stroke and other problems related to obesity. Your caregiver can help determine your BMI and based on it develop an exercise and dietary program to help you achieve or maintain this important measurement at a healthful level.  High blood pressure causes heart and blood vessel problems.  Persistent high blood pressure should be treated with medicine if weight loss and exercise do not work.   Fat and cholesterol leaves deposits in your arteries   that can block them. This causes heart disease and vessel disease elsewhere in your body.  If your cholesterol is found to be high, or if you have heart disease or certain other medical conditions, then you may need to have your cholesterol monitored frequently and be treated with medication.   Ask if you should have a stress test if your history suggests this. A stress test is a test done on  a treadmill that looks for heart disease. This test can find disease prior to there being a problem.  Avoid drinking alcohol in excess (more than two drinks per day).  Avoid use of street drugs. Do not share needles with anyone. Ask for professional help if you need assistance or instructions on stopping the use of alcohol, cigarettes, and/or drugs.  Brush your teeth twice a day with fluoride toothpaste, and floss once a day. Good oral hygiene prevents tooth decay and gum disease. The problems can be painful, unattractive, and can cause other health problems. Visit your dentist for a routine oral and dental check up and preventive care every 6-12 months.   Look at your skin regularly.  Use a mirror to look at your back. Notify your caregivers of changes in moles, especially if there are changes in shapes, colors, a size larger than a pencil eraser, an irregular border, or development of new moles.  Safety:  Use seatbelts 100% of the time, whether driving or as a passenger.  Use safety devices such as hearing protection if you work in environments with loud noise or significant background noise.  Use safety glasses when doing any work that could send debris in to the eyes.  Use a helmet if you ride a bike or motorcycle.  Use appropriate safety gear for contact sports.  Talk to your caregiver about gun safety.  Use sunscreen with a SPF (or skin protection factor) of 15 or greater.  Lighter skinned people are at a greater risk of skin cancer. Don't forget to also wear sunglasses in order to protect your eyes from too much damaging sunlight. Damaging sunlight can accelerate cataract formation.   Practice safe sex. Use condoms. Condoms are used for birth control and to help reduce the spread of sexually transmitted infections (or STIs).  Some of the STIs are gonorrhea (the clap), chlamydia, syphilis, trichomonas, herpes, HPV (human papilloma virus) and HIV (human immunodeficiency virus) which causes AIDS.  The herpes, HIV and HPV are viral illnesses that have no cure. These can result in disability, cancer and death.   Keep carbon monoxide and smoke detectors in your home functioning at all times. Change the batteries every 6 months or use a model that plugs into the wall.   Vaccinations:  Stay up to date with your tetanus shots and other required immunizations. You should have a booster for tetanus every 10 years. Be sure to get your flu shot every year, since 5%-20% of the [place] population comes down with the flu. The flu vaccine changes each year, so being vaccinated once is not enough. Get your shot in the fall, before the flu season peaks.   Other vaccines to consider:  Pneumococcal vaccine to protect against certain types of pneumonia.  This is normally recommended for adults age 65 or older.  However, adults younger than 28 years old with certain underlying conditions such as diabetes, heart or lung disease should also receive the vaccine.  Shingles vaccine to protect against Varicella Zoster if you are older than age 60, or younger   than 28 years old with certain underlying illness.  Hepatitis A vaccine to protect against a form of infection of the liver by a virus acquired from food.  Hepatitis B vaccine to protect against a form of infection of the liver by a virus acquired from blood or body fluids, particularly if you work in health care.  If you plan to travel internationally, check with your local health department for specific vaccination recommendations.  Cancer Screening:  Most routine colon cancer screening begins at the age of 50. On a yearly basis, doctors may provide special easy to use take-home tests to check for hidden blood in the stool. Sigmoidoscopy or colonoscopy can detect the earliest forms of colon cancer and is life saving. These tests use a small camera at the end of a tube to directly examine the colon. Speak to your caregiver about this at age 50, when routine  screening begins (and is repeated every 5 years unless early forms of pre-cancerous polyps or small growths are found).   At the age of 50 men usually start screening for prostate cancer every year. Screening may begin at a younger age for those with higher risk. Those at higher risk include African-Americans or having a family history of prostate cancer. There are two types of tests for prostate cancer:   Prostate-specific antigen (PSA) testing. Recent studies raise questions about prostate cancer using PSA and you should discuss this with your caregiver.   Digital rectal exam (in which your doctor's lubricated and gloved finger feels for enlargement of the prostate through the anus).   Screening for testicular cancer.  Do a monthly exam of your testicles. Gently roll each testicle between your thumb and fingers, feeling for any abnormal lumps. The best time to do this is after a hot shower or bath when the tissues are looser. Notify your caregivers of any lumps, tenderness or changes in size or shape immediately.     

## 2012-12-06 NOTE — Progress Notes (Signed)
Subjective:   HPI  Dean Carter is a 27 y.o. male who presents for a complete physical. Been doing well in general. No specific complaint today. He is about 10 pounds lighter than a year ago on last physical. He has been more aggressively eating healthy and exercising regularly.   Preventative care: Last ophthalmology visit:yes- Dr. Laural Benes last eye exam 11/29/2012 Last dental visit:yes- Dr. Verner Chol Last colonoscopy:n/a Last prostate exam: n/a Last EKG:n/a Last labs:2013  Prior vaccinations: TD or Tdap:10/2012 Influenza: Today Pneumococcal:n/a Shingles/Zostavax:n/a  Advanced directive:n/a Health care power of attorney:n/a Living will:n/a  Concerns: None  Reviewed their medical, surgical, family, social, medication, and allergy history and updated chart as appropriate.  Past Medical History  Diagnosis Date  . Allergy     RHINITIS  . Farsightedness     wears contacts  . Diverticulitis 03/2012    Past Surgical History  Procedure Laterality Date  . Appendectomy    . Wisdom tooth extraction      History   Social History  . Marital Status: Single    Spouse Name: N/A    Number of Children: N/A  . Years of Education: N/A   Occupational History  . Emergency planning/management officer - Scientist, research (life sciences)   Social History Main Topics  . Smoking status: Never Smoker   . Smokeless tobacco: Never Used  . Alcohol Use: 1.0 oz/week    2 drink(s) per week  . Drug Use: No  . 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 Not on file   Social History Narrative   Single, no children, exercise - tennis, softball, gym with spin.  Holiday representative - IT trainer.     Family History  Problem Relation Age of Onset  . Diabetes Mother     TYPE 2  . Heart disease Paternal Uncle     CABG  . Stroke Maternal Grandmother   . Heart disease Paternal  Grandfather     CABG  . Cancer Neg Hx   . Hypertension Neg Hx   . Hyperlipidemia Neg Hx     Current outpatient prescriptions:loratadine (CLARITIN) 10 MG tablet, Take 10 mg by mouth daily., Disp: , Rfl: ;  Multiple Vitamin (MULTIVITAMIN) capsule, Take 1 capsule by mouth daily., Disp: , Rfl: ;  vitamin B-12 (CYANOCOBALAMIN) 1000 MCG tablet, Take 1,000 mcg by mouth daily.  , Disp: , Rfl:   No Known Allergies     Review of Systems Constitutional: -fever, -chills, -sweats, -unexpected weight change, -decreased appetite, -fatigue Allergy: -sneezing, -itching, -congestion Dermatology: -changing moles, --rash, -lumps ENT: -runny nose, -ear pain, -sore throat, -hoarseness, -sinus pain, -teeth pain, - ringing in ears, -hearing loss, -nosebleeds Cardiology: -chest pain, -palpitations, -swelling, -difficulty breathing when lying flat, -waking up short of breath Respiratory: -cough, -shortness of breath, -difficulty breathing with exercise or exertion, -wheezing, -coughing up blood Gastroenterology: -abdominal pain, -nausea, -vomiting, -diarrhea, -constipation, -blood in stool, -changes in bowel movement, -difficulty swallowing or eating Hematology: -bleeding, -bruising  Musculoskeletal: -joint aches, -muscle aches, -joint swelling, -back pain, -neck pain, -cramping, -changes in gait Ophthalmology: denies vision changes, eye redness, itching, discharge Urology: -burning with urination, -difficulty urinating, -blood in urine, -urinary frequency, -urgency, -incontinence Neurology: -headache, -weakness, -tingling, -numbness, -memory loss, -falls, -dizziness Psychology: -depressed mood, -agitation, -sleep problems     Objective:   Physical Exam  BP 102/78  Pulse 72  Temp(Src) 98 F (  36.7 C) (Oral)  Resp 16  Ht 6' 1.5" (1.867 m)  Wt 242 lb (109.77 kg)  BMI 31.49 kg/m2  General appearance: alert, no distress, WD/WN, white male, overweight  Skin: unremarkable, few scattered benign appearing  macules, son tattoo upper back, 2 notable macules include right lower back with 8 mm x 4 mm flat brown benign-appearing macule triangular-shaped, right low back with 8 mm x 3 mm round flat brown macule HEENT: normocephalic, conjunctiva/corneas normal, sclerae anicteric, PERRLA, EOMi, nares patent, no discharge or erythema, pharynx normal  Oral cavity: MMM, tongue normal, teeth normal  Neck: supple, no lymphadenopathy, no thyromegaly, no masses, normal ROM, no bruits  Chest: non tender, normal shape and expansion  Heart: RRR, normal S1, S2, no murmurs  Lungs: CTA bilaterally, no wheezes, rhonchi, or rales  Abdomen: +bs, soft, non tender, non distended, no masses, no hepatomegaly, no splenomegaly, no bruits  Back: non tender, normal ROM, no scoliosis  Musculoskeletal: upper extremities non tender, no obvious deformity, normal ROM throughout, lower extremities non tender, no obvious deformity, normal ROM throughout  Extremities: no edema, no cyanosis, no clubbing  Pulses: 2+ symmetric, upper and lower extremities, normal cap refill  Neurological: alert, oriented x 3, CN2-12 intact, strength normal upper extremities and lower extremities, sensation normal throughout, DTRs 2+ throughout, no cerebellar signs, gait normal  Psychiatric: normal affect, behavior normal, pleasant  GU: normal external male genitalia, circumcised, no nodules, mass, hernias    Assessment and Plan :      Encounter Diagnoses  Name Primary?  . Routine general medical examination at a health care facility Yes  . Family history of diabetes mellitus type II   . Need for prophylactic vaccination and inoculation against influenza   . Obesity, unspecified     Physical exam - discussed healthy lifestyle, diet, exercise, preventative care, vaccinations, and addressed their concerns.  Advised he continue efforts to lose weight through exercise and healthy diet.  Go to visit dentist and eye doctor yearly. Routine fasting labs at  his request today.  Counseled on safe sex, alcohol use, seatbelt use, not testing while driving.  Counseled on the influenza virus vaccine.  Vaccine information sheet given.  Influenza vaccine given after consent obtained.  Follow-up pending labs

## 2012-12-09 ENCOUNTER — Other Ambulatory Visit: Payer: Self-pay | Admitting: Medical

## 2012-12-09 DIAGNOSIS — IMO0002 Reserved for concepts with insufficient information to code with codable children: Secondary | ICD-10-CM

## 2012-12-09 DIAGNOSIS — R7989 Other specified abnormal findings of blood chemistry: Secondary | ICD-10-CM

## 2012-12-10 ENCOUNTER — Encounter: Payer: Self-pay | Admitting: Family Medicine

## 2013-05-20 ENCOUNTER — Ambulatory Visit: Payer: 59 | Admitting: Medical

## 2013-05-21 ENCOUNTER — Encounter: Payer: Self-pay | Admitting: Medical

## 2013-05-21 ENCOUNTER — Ambulatory Visit (INDEPENDENT_AMBULATORY_CARE_PROVIDER_SITE_OTHER): Payer: 59 | Admitting: Medical

## 2013-05-21 VITALS — BP 108/78 | HR 80 | Temp 97.7°F | Wt 255.0 lb

## 2013-05-21 DIAGNOSIS — W57XXXA Bitten or stung by nonvenomous insect and other nonvenomous arthropods, initial encounter: Secondary | ICD-10-CM

## 2013-05-21 DIAGNOSIS — R21 Rash and other nonspecific skin eruption: Secondary | ICD-10-CM

## 2013-05-21 DIAGNOSIS — T148 Other injury of unspecified body region: Secondary | ICD-10-CM

## 2013-05-21 MED ORDER — PERMETHRIN 5 % EX CREA: 1.0000 "application " | TOPICAL_CREAM | Freq: Once | CUTANEOUS | Status: DC

## 2013-05-21 MED ORDER — HYDROXYZINE HCL 10 MG PO TABS: ORAL_TABLET | ORAL | Status: DC

## 2013-05-21 MED ORDER — TRIAMCINOLONE ACETONIDE 0.1 % EX CREA: 1.0000 "application " | TOPICAL_CREAM | Freq: Two times a day (BID) | CUTANEOUS | Status: DC

## 2013-05-21 NOTE — Progress Notes (Signed)
   Subjective:   Dean DickerRobert Carter is a 28 y.o. male presenting on 05/21/2013 with Rash and Pruritis  Lately whole body seems to be itching.  Has seen some bites.  Looking on Internet about this.  Got lice shampoo OTC, shaved the pubic region, and use the lotion last week.  Still itching all over, has some raised bumps on the scrotum.  He wonders about scabies.  Using nothing in the last few days.  Has been hiking as well.  No other aggravating or relieving factors.  No other complaint.  Review of Systems ROS as in subjective      Objective:     Filed Vitals:   05/21/13 0902  BP: 108/78  Pulse: 80  Temp: 97.7 F (36.5 C)    General appearance: alert, no distress, WD/WN Skin:  Scrotum with 5 raised pink/red inflamed somewhat urticarial vs bite approach, areas of excoriations on lower abdomen, but no other obvious bite marks, no other sores, wounds, or hand or feet lesions suggestive of scabies or other process      Assessment: Encounter Diagnoses  Name Primary?  . Rash and nonspecific skin eruption Yes  . Insect bite      Plan: Rash less likely scabies, but more likely chigger bites.   Begin Hydroxyzine oral x 5-7 days, Triamcinolone cream topically, and if desired just in case can do round of Permethrin cream.  Call if not resolving.  Dean Carter was seen today for rash and pruritis.  Diagnoses and associated orders for this visit:  Rash and nonspecific skin eruption  Insect bite  Other Orders - hydrOXYzine (ATARAX/VISTARIL) 10 MG tablet; 1 tablet TID for itching and rash, can double up dose at night time - triamcinolone cream (KENALOG) 0.1 %; Apply 1 application topically 2 (two) times daily. - permethrin (ELIMITE) 5 % cream; Apply 1 application topically once.    Return if symptoms worsen or fail to improve.

## 2013-05-21 NOTE — Patient Instructions (Signed)
Scabies  Scabies are small bugs (mites) that burrow under the skin and cause red bumps and severe itching. These bugs can only be seen with a microscope. Scabies are highly contagious. They can spread easily from person to person by direct contact. They are also spread through sharing clothing or linens that have the scabies mites living in them. It is not unusual for an entire family to become infected through shared towels, clothing, or bedding.   HOME CARE INSTRUCTIONS   · Your caregiver may prescribe a cream or lotion to kill the mites. If cream is prescribed, massage the cream into the entire body from the neck to the bottom of both feet. Also massage the cream into the scalp and face if your child is less than 1 year old. Avoid the eyes and mouth. Do not wash your hands after application.  · Leave the cream on for 8 to 12 hours. Your child should bathe or shower after the 8 to 12 hour application period. Sometimes it is helpful to apply the cream to your child right before bedtime.  · One treatment is usually effective and will eliminate approximately 95% of infestations. For severe cases, your caregiver may decide to repeat the treatment in 1 week. Everyone in your household should be treated with one application of the cream.  · New rashes or burrows should not appear within 24 to 48 hours after successful treatment. However, the itching and rash may last for 2 to 4 weeks after successful treatment. Your caregiver may prescribe a medicine to help with the itching or to help the rash go away more quickly.  · Scabies can live on clothing or linens for up to 3 days. All of your child's recently used clothing, towels, stuffed toys, and bed linens should be washed in hot water and then dried in a dryer for at least 20 minutes on high heat. Items that cannot be washed should be enclosed in a plastic bag for at least 3 days.  · To help relieve itching, bathe your child in a cool bath or apply cool washcloths to the  affected areas.  · Your child may return to school after treatment with the prescribed cream.  SEEK MEDICAL CARE IF:   · The itching persists longer than 4 weeks after treatment.  · The rash spreads or becomes infected. Signs of infection include red blisters or yellow-tan crust.  Document Released: 02/20/2005 Document Revised: 05/15/2011 Document Reviewed: 07/01/2008  [employer]® Patient Information ©2014 [employer].

## 2013-06-11 ENCOUNTER — Telehealth: Payer: Self-pay | Admitting: Medical

## 2013-06-11 NOTE — Telephone Encounter (Signed)
I SPOKE WITH THIS PATIENT AND THERE SEEM TO BE A MIX UP ABOUT URINE RESULTS. CLS

## 2013-06-11 NOTE — Telephone Encounter (Signed)
If you are referring to the August 2014 labs, Karren BurlyChandra did call back, but go over results again

## 2013-06-13 ENCOUNTER — Ambulatory Visit: Payer: 59 | Admitting: Medical

## 2013-06-16 ENCOUNTER — Ambulatory Visit (INDEPENDENT_AMBULATORY_CARE_PROVIDER_SITE_OTHER): Payer: 59 | Admitting: Medical

## 2013-06-16 VITALS — BP 112/70 | HR 72 | Temp 98.3°F | Resp 14 | Wt 253.0 lb

## 2013-06-16 DIAGNOSIS — Z202 Contact with and (suspected) exposure to infections with a predominantly sexual mode of transmission: Secondary | ICD-10-CM

## 2013-06-16 DIAGNOSIS — R21 Rash and other nonspecific skin eruption: Secondary | ICD-10-CM

## 2013-06-16 MED ORDER — DOXYCYCLINE HYCLATE 100 MG PO TABS: 100.0000 mg | ORAL_TABLET | Freq: Two times a day (BID) | ORAL | Status: DC

## 2013-06-16 NOTE — Progress Notes (Signed)
  Subjective:   Dean DickerRobert Carter is a 28 y.o. male presenting on 06/16/2013 with No chief complaint on file.  Here for recheck on rash.  I saw him in March for same. although there has been some improvement, still has the original bumps on the scrotum, generalized itching, and few small red bumps on left abdomen and between 2 fingers.   Since last visit used the Permethrin cream x 2 rounds, washed all bed sheets and clothes.  Still having same symptoms.  Used the Hydroxyzine with some relief.  Wants STD screening repeated given >1 sexual partner since last visit.  No other aggravating or relieving factors.  No other complaint.  Review of Systems ROS as in subjective      Objective:     Filed Vitals:   06/16/13 0815  BP: 112/70  Pulse: 72  Temp: 98.3 F (36.8 C)  Resp: 14    General appearance: alert, no distress, WD/WN Skin:  Scrotum with 5 raised pink/red inflamed somewhat urticarial, few areas of excoriations on lower abdomen, few small excoriated lesions between few fingers both hands, but no other obvious bite marks, no other sores, wounds, or hand or feet lesions suggestive of scabies or other process       Assessment: Encounter Diagnoses  Name Primary?  . Rash and nonspecific skin eruption Yes  . Venereal disease contact      Plan:: Labs today, c/t Hydroxyzine, if RPR negative, will use a round of Doxycycline   If not improving in 2 wk, consider dermatology referral.  Dean Carter was seen today for no specified reason.  Diagnoses and associated orders for this visit:  Rash and nonspecific skin eruption - doxycycline (VIBRA-TABS) 100 MG tablet; Take 1 tablet (100 mg total) by mouth 2 (two) times daily. - RPR - HIV antibody - GC/Chlamydia Probe Amp  Venereal disease contact - doxycycline (VIBRA-TABS) 100 MG tablet; Take 1 tablet (100 mg total) by mouth 2 (two) times daily. - RPR - HIV antibody - GC/Chlamydia Probe Amp    Return pending labs.

## 2013-06-17 LAB — GC/CHLAMYDIA PROBE AMP
CT PROBE, AMP APTIMA: NEGATIVE
GC PROBE AMP APTIMA: NEGATIVE

## 2013-06-17 LAB — HIV ANTIBODY (ROUTINE TESTING W REFLEX): HIV 1&2 Ab, 4th Generation: NONREACTIVE

## 2013-06-17 LAB — RPR

## 2013-07-24 ENCOUNTER — Encounter: Payer: Self-pay | Admitting: Medical

## 2013-07-24 ENCOUNTER — Ambulatory Visit (INDEPENDENT_AMBULATORY_CARE_PROVIDER_SITE_OTHER): Payer: 59 | Admitting: Medical

## 2013-07-24 VITALS — BP 114/78 | HR 80 | Temp 97.8°F | Resp 16 | Wt 250.0 lb

## 2013-07-24 DIAGNOSIS — Z8719 Personal history of other diseases of the digestive system: Secondary | ICD-10-CM

## 2013-07-24 DIAGNOSIS — R109 Unspecified abdominal pain: Secondary | ICD-10-CM

## 2013-07-24 LAB — POCT URINALYSIS DIPSTICK
Bilirubin, UA: NEGATIVE
Blood, UA: NEGATIVE
Glucose, UA: NEGATIVE
Ketones, UA: NEGATIVE
Leukocytes, UA: NEGATIVE
NITRITE UA: NEGATIVE
PH UA: 6
PROTEIN UA: NEGATIVE
SPEC GRAV UA: 1.015
UROBILINOGEN UA: NEGATIVE

## 2013-07-24 MED ORDER — AMOXICILLIN-POT CLAVULANATE ER 1000-62.5 MG PO TB12: 2.0000 | ORAL_TABLET | Freq: Two times a day (BID) | ORAL | Status: DC

## 2013-07-24 NOTE — Progress Notes (Signed)
   Subjective:   Dean Carter is a 28 y.o. male presenting on 07/24/2013 with Abdominal pain  [place], and recently having symptoms suggestive of diverticulitis again.  He reports few days hx/o pain around the belt line/lower central pelvic region, abdominal/pelvic pressure, walking or moving causes pain.  Sitting still not hurting as much.   Bumps in the road aggravates the pain.  No worse pain with eating.  Appetite is fine.  No nausea or vomiting.  No diarrhea.  No blood in stool.  Feels somewhat bloated, but no constipation.  Last BM last night, normal.  No hx/o prostatitis or ulcer.  S/p appendectomy. No new sexual contacts no concern for STD  No GU symptoms. No penile discharge, no scrotal pain.   No back pain. No respiratory symptoms No hip or joint pain. No family hx/o similar diverticulitis. No other aggravating or relieving factors.  No other complaint.  Past Medical History  Diagnosis Date  . Allergy     RHINITIS  . Farsightedness     wears contacts  . Diverticulitis 03/2012   Past Surgical History  Procedure Laterality Date  . Appendectomy    . Wisdom tooth extraction      Review of Systems ROS as in subjective      Objective:     Filed Vitals:   07/24/13 0814  BP: 114/78  Pulse: 80  Temp: 97.8 F (36.6 C)  Resp: 16    General appearance: alert, no distress, WD/WN Heart: RRR, normal S1, S2, no murmurs Lungs: CTA bilaterally, no wheezes, rhonchi, or rales Abdomen: + Increased bs throughout, soft, mild to moderate tenderness generalized, but little worse epigastric and suprapubic, non distended, no masses, no hepatomegaly, no splenomegaly Back: Nontender, no CVA tenderness Pulses: 2+ symmetric, upper and lower extremities Extremities no edema     Assessment: Encounter Diagnoses  Name Primary?  . Abdominal pain, unspecified site Yes  . History of diverticulitis      Plan: We discussed his symptoms which  are somewhat vague and nonspecific to diverticulitis, however he has a history of diverticulitis with similar symptoms. Urinalysis normal.   Given his concerns, advised bowel rest x 24-36 hours, and if worse, begin Augmentin.  Otherwise call back with symptoms update.    Over the next few days advise she limit solid food intake, increase fluid intake particular to her food and water, Tylenol for pain,   Dean Carter was seen today for abdominal pain.  Diagnoses and associated orders for this visit:  Abdominal pain, unspecified site  History of diverticulitis    Return if symptoms worsen or fail to improve.

## 2014-05-15 ENCOUNTER — Encounter: Payer: Self-pay | Admitting: Medical

## 2014-05-15 ENCOUNTER — Ambulatory Visit (INDEPENDENT_AMBULATORY_CARE_PROVIDER_SITE_OTHER): Payer: 59 | Admitting: Medical

## 2014-05-15 VITALS — BP 112/70 | HR 75 | Temp 97.7°F | Ht 73.0 in | Wt 257.0 lb

## 2014-05-15 DIAGNOSIS — Z8249 Family history of ischemic heart disease and other diseases of the circulatory system: Secondary | ICD-10-CM | POA: Diagnosis not present

## 2014-05-15 DIAGNOSIS — Z8719 Personal history of other diseases of the digestive system: Secondary | ICD-10-CM

## 2014-05-15 DIAGNOSIS — Z113 Encounter for screening for infections with a predominantly sexual mode of transmission: Secondary | ICD-10-CM | POA: Diagnosis not present

## 2014-05-15 DIAGNOSIS — E669 Obesity, unspecified: Secondary | ICD-10-CM | POA: Diagnosis not present

## 2014-05-15 DIAGNOSIS — Z0000 Encounter for general adult medical examination without abnormal findings: Secondary | ICD-10-CM

## 2014-05-15 LAB — CBC
HCT: 46.7 % (ref 39.0–52.0)
HEMOGLOBIN: 15.8 g/dL (ref 13.0–17.0)
MCH: 29.6 pg (ref 26.0–34.0)
MCHC: 33.8 g/dL (ref 30.0–36.0)
MCV: 87.6 fL (ref 78.0–100.0)
MPV: 10.5 fL (ref 8.6–12.4)
Platelets: 254 10*3/uL (ref 150–400)
RBC: 5.33 MIL/uL (ref 4.22–5.81)
RDW: 13.9 % (ref 11.5–15.5)
WBC: 7.3 10*3/uL (ref 4.0–10.5)

## 2014-05-15 LAB — LIPID PANEL
Cholesterol: 157 mg/dL (ref 0–200)
HDL: 44 mg/dL (ref >=40)
LDL CALC: 95 mg/dL (ref 0–99)
TRIGLYCERIDES: 91 mg/dL (ref <150)
Total CHOL/HDL Ratio: 3.6 Ratio
VLDL: 18 mg/dL (ref 0–40)

## 2014-05-15 LAB — POCT URINALYSIS DIPSTICK
Bilirubin, UA: NEGATIVE
GLUCOSE UA: NEGATIVE
Ketones, UA: NEGATIVE
Leukocytes, UA: NEGATIVE
NITRITE UA: NEGATIVE
PH UA: 7
Protein, UA: NEGATIVE
RBC UA: NEGATIVE
SPEC GRAV UA: 1.02
Urobilinogen, UA: NEGATIVE

## 2014-05-15 LAB — COMPREHENSIVE METABOLIC PANEL
ALBUMIN: 4.6 g/dL (ref 3.5–5.2)
ALT: 34 U/L (ref 0–53)
AST: 21 U/L (ref 0–37)
Alkaline Phosphatase: 47 U/L (ref 39–117)
BILIRUBIN TOTAL: 0.7 mg/dL (ref 0.2–1.2)
BUN: 12 mg/dL (ref 6–23)
CALCIUM: 9.2 mg/dL (ref 8.4–10.5)
CO2: 26 mEq/L (ref 19–32)
CREATININE: 1.07 mg/dL (ref 0.50–1.35)
Chloride: 102 mEq/L (ref 96–112)
Glucose, Bld: 79 mg/dL (ref 70–99)
POTASSIUM: 4.4 meq/L (ref 3.5–5.3)
Sodium: 138 mEq/L (ref 135–145)
TOTAL PROTEIN: 6.9 g/dL (ref 6.0–8.3)

## 2014-05-15 LAB — HEMOGLOBIN A1C
Hgb A1c MFr Bld: 5.9 % — ABNORMAL HIGH (ref <5.7)
MEAN PLASMA GLUCOSE: 123 mg/dL — AB (ref <117)

## 2014-05-15 LAB — TSH: TSH: 1.764 u[IU]/mL (ref 0.350–4.500)

## 2014-05-15 NOTE — Progress Notes (Signed)
Subjective:   HPI  Dean Carter is a 29 y.o. male who presents for a complete physical.   Preventative care: Last physical or labs: 2014 Sees dentist yearly: Yes SEEN YEARLY DR. Okey Regal Last tetanus vaccine, TD or Tdap:2014   Concerns: none  Reviewed their medical, surgical, family, social, medication, and allergy history and updated chart as appropriate.  Past Medical History  Diagnosis Date  . Allergy     RHINITIS  . Farsightedness     wears contacts  . Diverticulitis 03/2012    Past Surgical History  Procedure Laterality Date  . Appendectomy    . Wisdom tooth extraction      History   Social History  . Marital Status: Single    Spouse Name: N/A  . Number of Children: N/A  . Years of Education: N/A   Occupational History  . Emergency planning/management officer - Scientist, research (life sciences)   Social History Main Topics  . Smoking status: Never Smoker   . Smokeless tobacco: Never Used  . Alcohol Use: 1.0 oz/week    2 drink(s) per week  . Drug Use: No  . 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 Not on file   Social History Narrative   Single, no children, exercise - tennis, softball, gym with spin.  Holiday representative - IT trainer.     Family History  Problem Relation Age of Onset  . Diabetes Mother     TYPE 2  . Heart disease Paternal Uncle     CABG  . Stroke Maternal Grandmother   . Heart disease Paternal Grandfather     CABG  . Cancer Neg Hx   . Hypertension Neg Hx   . Hyperlipidemia Neg Hx      Current outpatient prescriptions:  Marland Kitchen  Multiple Vitamin (MULTIVITAMIN) capsule, Take 1 capsule by mouth daily., Disp: , Rfl:  .  permethrin (ELIMITE) 5 % cream, Apply 1 application topically once., Disp: 60 g, Rfl: 0 .  triamcinolone cream (KENALOG) 0.1 %, Apply 1 application topically 2 (two) times daily., Disp: 30 g, Rfl: 0 .   vitamin B-12 (CYANOCOBALAMIN) 1000 MCG tablet, Take 1,000 mcg by mouth daily.  , Disp: , Rfl:   No Known Allergies  Review of Systems Constitutional: -fever, -chills, -sweats, -unexpected weight change, -decreased appetite, -fatigue Allergy: +sneezing, -itching, -congestion Dermatology: -changing moles, --rash, -lumps ENT: -runny nose, -ear pain, -sore throat, -hoarseness, -sinus pain, -teeth pain, - ringing in ears, -hearing loss, -nosebleeds Cardiology: -chest pain, -palpitations, -swelling, -difficulty breathing when lying flat, -waking up short of breath Respiratory: -cough, -shortness of breath, -difficulty breathing with exercise or exertion, -wheezing, -coughing up blood Gastroenterology: -abdominal pain, -nausea, -vomiting, -diarrhea, -constipation, -blood in stool, -changes in bowel movement, -difficulty swallowing or eating Hematology: -bleeding, -bruising  Musculoskeletal: -joint aches, -muscle aches, -joint swelling, -back pain, -neck pain, -cramping, -changes in gait Ophthalmology: denies vision changes, eye redness, itching, discharge Urology: -burning with urination, -difficulty urinating, -blood in urine, -urinary frequency, -urgency, -incontinence Neurology: -headache, -weakness, -tingling, -numbness, -memory loss, -falls, -dizziness Psychology: -depressed mood, -agitation, -sleep problems     Objective:   Physical Exam  BP 112/70 mmHg  Pulse 75  Temp(Src) 97.7 F (36.5 C) (Oral)  Ht  (1.854 m)  Wt 257 lb (116.574 kg)  BMI 33.91 kg/m2  Wt Readings from Last 3 Encounters:  05/15/14 257 lb (116.574 kg)  07/24/13 250 lb (113.399 kg)  06/16/13 253 lb (114.76 kg)   General appearance: alert, no distress, WD/WN, white male, obesity Skin: unremarkable, few scattered benign appearing macules, sun tattoo upper back, 2 notable macules include right lower back with 8 mm x 4 mm flat brown benign-appearing macule triangular-shaped, right low back with 8 mm x 3 mm round  flat brown macule HEENT: normocephalic, conjunctiva/corneas normal, sclerae anicteric, PERRLA, EOMi, nares patent, no discharge or erythema, pharynx normal  Oral cavity: MMM, tongue normal, teeth normal  Neck: supple, no lymphadenopathy, no thyromegaly, no masses, normal ROM, no bruits  Chest: non tender, normal shape and expansion  Heart: RRR, normal S1, S2, no murmurs  Lungs: CTA bilaterally, no wheezes, rhonchi, or rales  Abdomen: +bs, soft, non tender, non distended, no masses, no hepatomegaly, no splenomegaly, no bruits  Back: non tender, normal ROM, no scoliosis  Musculoskeletal: upper extremities non tender, no obvious deformity, normal ROM throughout, lower extremities non tender, no obvious deformity, normal ROM throughout  Extremities: no edema, no cyanosis, no clubbing  Pulses: 2+ symmetric, upper and lower extremities, normal cap refill  Neurological: alert, oriented x 3, CN2-12 intact, strength normal upper extremities and lower extremities, sensation normal throughout, DTRs 2+ throughout, no cerebellar signs, gait normal  Psychiatric: normal affect, behavior normal, pleasant  GU: normal external male genitalia, circumcised, mild varicocele left, no nodules, mass, hernias    Assessment and Plan :    Encounter Diagnoses  Name Primary?  . Encounter for health maintenance examination in adult Yes  . Obesity   . History of diverticulitis   . Screen for STD (sexually transmitted disease)   . Family history of heart disease     Physical exam - discussed healthy lifestyle, diet, exercise, preventative care, vaccinations, and addressed their concerns.   See your dentist yearly for routine dental care including hygiene visits twice yearly. See your eye doctor yearly for routine vision care. Routine and STD labs today.  Vaccinations: None today.  Up to date on Tdap.  Recommended yearly flu shot.  Follow up pending labs

## 2014-05-15 NOTE — Addendum Note (Signed)
Addended by: Janeice RobinsonSCALES, Zavion Sleight L on: 05/15/2014 10:27 AM   Modules accepted: Orders

## 2014-05-16 LAB — HIGH SENSITIVITY CRP: CRP, High Sensitivity: 0.9 mg/L

## 2014-05-16 LAB — RPR

## 2014-05-16 LAB — HIV ANTIBODY (ROUTINE TESTING W REFLEX): HIV: NONREACTIVE

## 2014-05-16 LAB — GC/CHLAMYDIA PROBE AMP
CT Probe RNA: NEGATIVE
GC Probe RNA: NEGATIVE

## 2014-06-16 ENCOUNTER — Ambulatory Visit (INDEPENDENT_AMBULATORY_CARE_PROVIDER_SITE_OTHER): Payer: 59 | Admitting: Medical

## 2014-06-16 ENCOUNTER — Telehealth: Payer: Self-pay | Admitting: Medical

## 2014-06-16 ENCOUNTER — Encounter: Payer: Self-pay | Admitting: Medical

## 2014-06-16 VITALS — BP 102/80 | HR 76 | Temp 98.0°F | Resp 16 | Wt 259.0 lb

## 2014-06-16 DIAGNOSIS — R1012 Left upper quadrant pain: Secondary | ICD-10-CM

## 2014-06-16 DIAGNOSIS — R1032 Left lower quadrant pain: Secondary | ICD-10-CM

## 2014-06-16 DIAGNOSIS — Z8719 Personal history of other diseases of the digestive system: Secondary | ICD-10-CM

## 2014-06-16 MED ORDER — AMOXICILLIN-POT CLAVULANATE 875-125 MG PO TABS: 1.0000 | ORAL_TABLET | Freq: Two times a day (BID) | ORAL | Status: DC

## 2014-06-16 MED ORDER — HYDROCODONE-ACETAMINOPHEN 5-325 MG PO TABS: 1.0000 | ORAL_TABLET | Freq: Four times a day (QID) | ORAL | Status: DC | PRN

## 2014-06-16 MED ORDER — ONDANSETRON HCL 4 MG PO TABS: 4.0000 mg | ORAL_TABLET | Freq: Three times a day (TID) | ORAL | Status: DC | PRN

## 2014-06-16 NOTE — Telephone Encounter (Signed)
Refer to GI for recurrent diverticulitis.  See if 2014 hospitalization records are in old chart, or try to get copy of these

## 2014-06-16 NOTE — Progress Notes (Signed)
Subjective:   Dean Carter is a 29 y.o. male presenting on 06/16/2014 with Diverticulitis  Over the weekend in the past 3-4 days been having symptoms of possible diverticulitis.  He has had this prior in 2014 with hospitalization and last year in May.  Symptoms started in the lower abdomen and has continued they are as well as left side in general but mostly left lower quadrant.  Feels constipated like gas build up or can't get relief with defecating.  He was at the [employer] last week, did drink 6-8 beers daily that weekend. But denies fever, nausea, vomiting, no blood in the stool.  No loose stool.   Last BM yesterday seemed normal, didn't seem to change the pain.   No back pain.   No scrotal or genital pain or swelling, no penile discharge, no dysuria.   No urinary frequency, no change in urination at all.   Denies history of pancreatitis. Last alcoholic drink was Sunday afternoon 3 days ago.  has hx/o diverticulitis 03/2012 in [place], and recently having the same symptoms that he had with the prior diverticulitis again. Using nothing for the symptoms currently. Fiber intake in general was not centigrade.  No GU symptoms. No penile discharge, no scrotal pain.   No back pain. No respiratory symptoms No hip or joint pain. No family hx/o similar diverticulitis. No other aggravating or relieving factors.  No other complaint.  Past Medical History  Diagnosis Date  . Allergy     RHINITIS  . Farsightedness     wears contacts  . Diverticulitis 03/2012   Past Surgical History  Procedure Laterality Date  . Appendectomy    . Wisdom tooth extraction      Review of Systems ROS as in subjective      Objective:   Filed Vitals:   06/16/14 0838  BP: 102/80  Pulse: 76  Temp: 98 F (36.7 C)  Resp: 16    General appearance: alert, no distress, WD/WN Heart: RRR, normal S1, S2, no murmurs Lungs: CTA bilaterally, no wheezes, rhonchi, or rales Abdomen: Seemingly normal  bs throughout, soft, mild to moderate tenderness generalized on the left, but little worse left lower quadrant, non distended, no masses, no hepatomegaly, no splenomegaly Back: Nontender, no CVA tenderness Pulses: 2+ symmetric, upper and lower extremities Extremities no edema     Assessment: Encounter Diagnoses  Name Primary?  . Abdominal pain, left lower quadrant Yes  . Abdominal pain, left upper quadrant   . History of diverticulitis     Plan: We discussed his symptoms and exam findings. There is the possibility of pancreatitis with more than likely diverticulitis giving the symptoms, location of the worst pain and prior similar presentation in 2014 and a year ago in May.  We will search the paper record to see if we have records from 2014 hospitalization for diverticulitis. We discussed possible ways to move forward but he declines labs or CT scan today. Thus we will treat for possible pancreatitis and diverticulitis, begin Augmentin, hydrocodone when necessary for pain, Zofran when necessary for nausea, advised bowel rest x 24-36 hours, and will refer to GI as this may be his third episode of diverticulitis.  No prior colonoscopy.  Over the next few days advise he limit solid food intake, increase fluid intake particular to her food and water.  If worse or not improving the next few days then call or return sooner.   Dean Carter was seen today for diverticulitis.  Diagnoses and all  orders for this visit:  Abdominal pain, left lower quadrant Orders: -     Ambulatory referral to Gastroenterology  Abdominal pain, left upper quadrant Orders: -     Ambulatory referral to Gastroenterology  History of diverticulitis Orders: -     Ambulatory referral to Gastroenterology  Other orders -     HYDROcodone-acetaminophen (NORCO/VICODIN) 5-325 MG per tablet; Take 1 tablet by mouth every 6 (six) hours as needed for moderate pain. -     ondansetron (ZOFRAN) 4 MG tablet; Take 1 tablet (4 mg total)  by mouth every 8 (eight) hours as needed for nausea or vomiting. -     amoxicillin-clavulanate (AUGMENTIN) 875-125 MG per tablet; Take 1 tablet by mouth 2 (two) times daily.  Return refer to GI.

## 2014-06-16 NOTE — Telephone Encounter (Signed)
I don't see the UA result from Monday.

## 2014-06-17 LAB — POCT URINALYSIS DIPSTICK
Bilirubin, UA: NEGATIVE
Blood, UA: NEGATIVE
GLUCOSE UA: NEGATIVE
Ketones, UA: NEGATIVE
LEUKOCYTES UA: NEGATIVE
NITRITE UA: NEGATIVE
Protein, UA: NEGATIVE
Spec Grav, UA: 1.025
UROBILINOGEN UA: NEGATIVE
pH, UA: 6.5

## 2014-06-17 NOTE — Telephone Encounter (Signed)
DONE UA RESULTS ENTERED

## 2014-06-19 ENCOUNTER — Other Ambulatory Visit: Payer: Self-pay

## 2014-06-19 DIAGNOSIS — K5792 Diverticulitis of intestine, part unspecified, without perforation or abscess without bleeding: Secondary | ICD-10-CM

## 2014-06-19 NOTE — Telephone Encounter (Signed)
Records not in chart referral has been put in

## 2015-05-17 ENCOUNTER — Encounter: Payer: Self-pay | Admitting: Medical

## 2015-05-17 ENCOUNTER — Ambulatory Visit (INDEPENDENT_AMBULATORY_CARE_PROVIDER_SITE_OTHER): Payer: Commercial Managed Care - PPO | Admitting: Medical

## 2015-05-17 VITALS — BP 118/88 | HR 80 | Ht 72.5 in | Wt 252.0 lb

## 2015-05-17 DIAGNOSIS — Z113 Encounter for screening for infections with a predominantly sexual mode of transmission: Secondary | ICD-10-CM | POA: Diagnosis not present

## 2015-05-17 DIAGNOSIS — Z0000 Encounter for general adult medical examination without abnormal findings: Secondary | ICD-10-CM | POA: Insufficient documentation

## 2015-05-17 DIAGNOSIS — E669 Obesity, unspecified: Secondary | ICD-10-CM | POA: Diagnosis not present

## 2015-05-17 DIAGNOSIS — Z2821 Immunization not carried out because of patient refusal: Secondary | ICD-10-CM | POA: Insufficient documentation

## 2015-05-17 DIAGNOSIS — R7301 Impaired fasting glucose: Secondary | ICD-10-CM

## 2015-05-17 LAB — HEMOGLOBIN A1C
Hgb A1c MFr Bld: 5.8 % — ABNORMAL HIGH (ref <5.7)
Mean Plasma Glucose: 120 mg/dL — ABNORMAL HIGH (ref <117)

## 2015-05-17 LAB — POCT URINALYSIS DIPSTICK
BILIRUBIN UA: NEGATIVE
Blood, UA: NEGATIVE
GLUCOSE UA: NEGATIVE
KETONES UA: NEGATIVE
Leukocytes, UA: NEGATIVE
Nitrite, UA: NEGATIVE
Protein, UA: NEGATIVE
UROBILINOGEN UA: NEGATIVE
pH, UA: 6

## 2015-05-17 LAB — COMPREHENSIVE METABOLIC PANEL
ALBUMIN: 4.6 g/dL (ref 3.6–5.1)
ALT: 34 U/L (ref 9–46)
AST: 19 U/L (ref 10–40)
Alkaline Phosphatase: 51 U/L (ref 40–115)
BILIRUBIN TOTAL: 0.7 mg/dL (ref 0.2–1.2)
BUN: 12 mg/dL (ref 7–25)
CALCIUM: 9.5 mg/dL (ref 8.6–10.3)
CHLORIDE: 103 mmol/L (ref 98–110)
CO2: 26 mmol/L (ref 20–31)
CREATININE: 1.04 mg/dL (ref 0.60–1.35)
Glucose, Bld: 86 mg/dL (ref 65–99)
Potassium: 4.5 mmol/L (ref 3.5–5.3)
SODIUM: 139 mmol/L (ref 135–146)
TOTAL PROTEIN: 6.6 g/dL (ref 6.1–8.1)

## 2015-05-17 LAB — LIPID PANEL
CHOLESTEROL: 153 mg/dL (ref 125–200)
HDL: 37 mg/dL — AB (ref >=40)
LDL Cholesterol: 97 mg/dL (ref <130)
TRIGLYCERIDES: 93 mg/dL (ref <150)
Total CHOL/HDL Ratio: 4.1 Ratio (ref <=5.0)
VLDL: 19 mg/dL (ref <30)

## 2015-05-17 NOTE — Progress Notes (Signed)
Subjective:   HPI  Dean Carter is a 30 y.o. male who presents for a complete physical.   Concerns: none  Reviewed their medical, surgical, family, social, medication, and allergy history and updated chart as appropriate.  Past Medical History  Diagnosis Date  . Allergy     RHINITIS  . Farsightedness     wears contacts  . Diverticulitis 03/2012    few occurrences as of 05/2015    Past Surgical History  Procedure Laterality Date  . Appendectomy    . Wisdom tooth extraction      Social History   Social History  . Marital Status: Single    Spouse Name: N/A  . Number of Children: N/A  . Years of Education: N/A   Occupational History  . Emergency planning/management officer - Scientist, research (life sciences)   Social History Main Topics  . Smoking status: Never Smoker   . Smokeless tobacco: Never Used  . Alcohol Use: 3.0 oz/week    3 Cans of beer, 2 Standard drinks or equivalent per week  . Drug Use: No  . 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 Not on file   Social History Narrative   Single, no current partner, no children, exercise - tennis, softball, gym with spin. Playing in basketball league as of 05/2015.  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    Family History  Problem Relation Age of Onset  . Diabetes Mother     TYPE 2  . Heart disease Paternal Uncle     CABG  . Stroke Maternal Grandmother   . Heart disease Paternal Grandfather     CABG  . Cancer Neg Hx   . Hypertension Neg Hx   . Hyperlipidemia Neg Hx      Current outpatient prescriptions:  .  loratadine (CLARITIN) 10 MG tablet, Take 10 mg by mouth daily., Disp: , Rfl:   No Known Allergies  Review of Systems Constitutional: -fever, -chills, -sweats, -unexpected weight change, -decreased appetite, -fatigue Allergy: +sneezing, -itching,  -congestion Dermatology: -changing moles, --rash, -lumps ENT: -runny nose, -ear pain, -sore throat, -hoarseness, -sinus pain, -teeth pain, - ringing in ears, -hearing loss, -nosebleeds Cardiology: -chest pain, -palpitations, -swelling, -difficulty breathing when lying flat, -waking up short of breath Respiratory: -cough, -shortness of breath, -difficulty breathing with exercise or exertion, -wheezing, -coughing up blood Gastroenterology: -abdominal pain, -nausea, -vomiting, -diarrhea, -constipation, -blood in stool, -changes in bowel movement, -difficulty swallowing or eating Hematology: -bleeding, -bruising  Musculoskeletal: -joint aches, -muscle aches, -joint swelling, -back pain, -neck pain, -cramping, -changes in gait Ophthalmology: denies vision changes, eye redness, itching, discharge Urology: -burning with urination, -difficulty urinating, -blood in urine, -urinary frequency, -urgency, -incontinence Neurology: -headache, -weakness, -tingling, -numbness, -memory loss, -falls, -dizziness Psychology: -depressed mood, -agitation, -sleep problems     Objective:   Physical Exam  BP 118/88 mmHg  Pulse 80  Ht 6' 0.5" (1.842 m)  Wt 252 lb (114.306 kg)  BMI 33.69 kg/m2  Wt Readings from Last 3 Encounters:  05/17/15 252 lb (114.306 kg)  06/16/14 259 lb (117.482 kg)  05/15/14 257 lb (116.574 kg)   General appearance: alert, no distress, WD/WN, white male, obesity Skin: unremarkable, few scattered benign appearing macules, sun tattoo upper back, 2 notable macules include right lower back with 8 mm x 4 mm flat brown benign-appearing macule triangular-shaped, right low back with 8  mm x 3 mm round flat brown macule HEENT: normocephalic, conjunctiva/corneas normal, sclerae anicteric, PERRLA, EOMi, nares patent, no discharge or erythema, pharynx normal  Oral cavity: MMM, tongue normal, teeth normal  Neck: supple, no lymphadenopathy, no thyromegaly, no masses, normal ROM, no bruits  Chest:  non tender, normal shape and expansion  Heart: RRR, normal S1, S2, no murmurs  Lungs: CTA bilaterally, no wheezes, rhonchi, or rales  Abdomen: +bs, soft, non tender, non distended, no masses, no hepatomegaly, no splenomegaly, no bruits  Back: non tender, normal ROM, no scoliosis  Musculoskeletal: upper extremities non tender, no obvious deformity, normal ROM throughout, lower extremities non tender, no obvious deformity, normal ROM throughout  Extremities: no edema, no cyanosis, no clubbing  Pulses: 2+ symmetric, upper and lower extremities, normal cap refill  Neurological: alert, oriented x 3, CN2-12 intact, strength normal upper extremities and lower extremities, sensation normal throughout, DTRs 2+ throughout, no cerebellar signs, gait normal  Psychiatric: normal affect, behavior normal, pleasant  GU: normal external male genitalia, circumcised, mild varicocele left, no nodules, mass, hernias    Assessment and Plan :    Encounter Diagnoses  Name Primary?  . Routine general medical examination at a health care facility Yes  . Screen for STD (sexually transmitted disease)   . Impaired fasting blood sugar   . Obesity   . Influenza vaccination declined     Physical exam - discussed healthy lifestyle, diet, exercise, preventative care, vaccinations, and addressed their concerns.   See your dentist yearly for routine dental care including hygiene visits twice yearly. See your eye doctor yearly for routine vision care. Routine and STD labs today.  Vaccinations: None today.  Up to date on Tdap.  Recommended yearly flu shot.  Follow up pending labs

## 2015-05-18 LAB — GC/CHLAMYDIA PROBE AMP
CT PROBE, AMP APTIMA: NOT DETECTED
GC PROBE AMP APTIMA: NOT DETECTED

## 2015-05-18 LAB — HIV ANTIBODY (ROUTINE TESTING W REFLEX): HIV: NONREACTIVE

## 2015-05-18 LAB — RPR

## 2016-08-22 DIAGNOSIS — H52223 Regular astigmatism, bilateral: Secondary | ICD-10-CM | POA: Diagnosis not present

## 2016-08-22 DIAGNOSIS — H5213 Myopia, bilateral: Secondary | ICD-10-CM | POA: Diagnosis not present

## 2016-09-29 ENCOUNTER — Ambulatory Visit (INDEPENDENT_AMBULATORY_CARE_PROVIDER_SITE_OTHER): Payer: Commercial Managed Care - PPO | Admitting: Medical

## 2016-09-29 ENCOUNTER — Encounter: Payer: Self-pay | Admitting: Medical

## 2016-09-29 VITALS — BP 112/80 | HR 76 | Resp 16 | Ht 72.5 in | Wt 241.0 lb

## 2016-09-29 DIAGNOSIS — Z6832 Body mass index (BMI) 32.0-32.9, adult: Secondary | ICD-10-CM | POA: Diagnosis not present

## 2016-09-29 DIAGNOSIS — R7301 Impaired fasting glucose: Secondary | ICD-10-CM | POA: Diagnosis not present

## 2016-09-29 DIAGNOSIS — E66811 Obesity, class 1: Secondary | ICD-10-CM

## 2016-09-29 DIAGNOSIS — Z0000 Encounter for general adult medical examination without abnormal findings: Secondary | ICD-10-CM | POA: Diagnosis not present

## 2016-09-29 DIAGNOSIS — E669 Obesity, unspecified: Secondary | ICD-10-CM

## 2016-09-29 DIAGNOSIS — R748 Abnormal levels of other serum enzymes: Secondary | ICD-10-CM | POA: Diagnosis not present

## 2016-09-29 LAB — CBC
HCT: 47.1 % (ref 38.5–50.0)
Hemoglobin: 15.8 g/dL (ref 13.2–17.1)
MCH: 30.1 pg (ref 27.0–33.0)
MCHC: 33.5 g/dL (ref 32.0–36.0)
MCV: 89.7 fL (ref 80.0–100.0)
MPV: 10.5 fL (ref 7.5–12.5)
PLATELETS: 258 10*3/uL (ref 140–400)
RBC: 5.25 MIL/uL (ref 4.20–5.80)
RDW: 13.7 % (ref 11.0–15.0)
WBC: 7.1 10*3/uL (ref 4.0–10.5)

## 2016-09-29 LAB — POCT URINALYSIS DIP (PROADVANTAGE DEVICE)
BILIRUBIN UA: NEGATIVE
BILIRUBIN UA: NEGATIVE mg/dL
GLUCOSE UA: NEGATIVE mg/dL
Leukocytes, UA: NEGATIVE
Nitrite, UA: NEGATIVE
RBC UA: NEGATIVE
SPECIFIC GRAVITY, URINE: 1.02
UUROB: NEGATIVE
pH, UA: 6.5 (ref 5.0–8.0)

## 2016-09-29 NOTE — Progress Notes (Signed)
Subjective:   HPI  Dean DickerRobert Carter is a 31 y.o. male who presents for a complete physical.   Concerns: none  Reviewed their medical, surgical, family, social, medication, and allergy history and updated chart as appropriate.  Past Medical History:  Diagnosis Date  . Allergy    RHINITIS  . Diverticulitis 03/2012   few occurrences as of 05/2015  . Farsightedness    wears contacts    Past Surgical History:  Procedure Laterality Date  . APPENDECTOMY    . WISDOM TOOTH EXTRACTION      Social History   Social History  . Marital status: Single    Spouse name: N/A  . Number of children: N/A  . Years of education: N/A   Occupational History  . 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 Smoking status: Never Smoker  . Smokeless tobacco: Never Used  . Alcohol use 3.0 oz/week    3 Cans of beer, 2 Standard drinks or equivalent per week  . Drug use: No  . Sexual activity: Not on file     Comment: [employer], single, exercise - softball, run, stationary bike   Other Topics Concern  . Not on file   Social History Narrative   Single, no current partner, no children, exercise - tennis, softball, gym with spin. Playing in basketball league as of 09/2016.  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 Diabetes Mother        TYPE 2  . Heart disease Paternal Uncle        CABG  . Stroke Maternal Grandmother   . Heart disease Paternal Grandfather        CABG  . Cancer Neg Hx   . Hypertension Neg Hx   . Hyperlipidemia Neg Hx      Current Outpatient Prescriptions:  .  loratadine (CLARITIN) 10 MG tablet, Take 10 mg by mouth daily., Disp: , Rfl:   No Known Allergies  Review of Systems Constitutional: -fever, -chills, -sweats, -unexpected weight change, -decreased appetite, -fatigue Allergy: +sneezing, -itching,  -congestion Dermatology: -changing moles, --rash, -lumps ENT: -runny nose, -ear pain, -sore throat, -hoarseness, -sinus pain, -teeth pain, - ringing in ears, -hearing loss, -nosebleeds Cardiology: -chest pain, -palpitations, -swelling, -difficulty breathing when lying flat, -waking up short of breath Respiratory: -cough, -shortness of breath, -difficulty breathing with exercise or exertion, -wheezing, -coughing up blood Gastroenterology: -abdominal pain, -nausea, -vomiting, -diarrhea, -constipation, -blood in stool, -changes in bowel movement, -difficulty swallowing or eating Hematology: -bleeding, -bruising  Musculoskeletal: -joint aches, -muscle aches, -joint swelling, -back pain, -neck pain, -cramping, -changes in gait Ophthalmology: denies vision changes, eye redness, itching, discharge Urology: -burning with urination, -difficulty urinating, -blood in urine, -urinary frequency, -urgency, -incontinence Neurology: -headache, -weakness, -tingling, -numbness, -memory loss, -falls, -dizziness Psychology: -depressed mood, -agitation, -sleep problems     Objective:   Physical Exam  BP 112/80   Pulse 76   Resp 16   Ht 6' 0.5" (1.842 m)   Wt 241 lb (109.3 kg)   SpO2 94%   BMI 32.24 kg/m   Wt Readings from Last 3 Encounters:  09/29/16 241 lb (109.3 kg)  05/17/15 252 lb (114.3 kg)  06/16/14 259 lb (117.5 kg)   General appearance: alert, no distress, WD/WN, white male, obesity Skin: unremarkable, few scattered benign appearing macules, sun tattoo upper back, 2 notable macules include right  lower back with 8 mm x 4 mm flat brown benign-appearing macule triangular-shaped, right low back with 8 mm x 3 mm round flat brown macule HEENT: normocephalic, conjunctiva/corneas normal, sclerae anicteric, PERRLA, EOMi, nares patent, no discharge or erythema, pharynx normal  Oral cavity: MMM, tongue normal, teeth normal  Neck: supple, no lymphadenopathy, no thyromegaly, no masses, normal ROM, no  bruits  Chest: non tender, normal shape and expansion  Heart: RRR, normal S1, S2, no murmurs  Lungs: CTA bilaterally, no wheezes, rhonchi, or rales  Abdomen: +bs, soft, non tender, non distended, no masses, no hepatomegaly, no splenomegaly, no bruits  Back: non tender, normal ROM, no scoliosis  Musculoskeletal: upper extremities non tender, no obvious deformity, normal ROM throughout, lower extremities non tender, no obvious deformity, normal ROM throughout  Extremities: no edema, no cyanosis, no clubbing  Pulses: 2+ symmetric, upper and lower extremities, normal cap refill  Neurological: alert, oriented x 3, CN2-12 intact, strength normal upper extremities and lower extremities, sensation normal throughout, DTRs 2+ throughout, no cerebellar signs, gait normal  Psychiatric: normal affect, behavior normal, pleasant  GU: normal external male genitalia, circumcised, mild varicocele left, no nodules, mass, hernias    Assessment and Plan :    Encounter Diagnoses  Name Primary?  . Routine general medical examination at a health care facility Yes  . Impaired fasting blood sugar   . Class 1 obesity without serious comorbidity with body mass index (BMI) of 32.0 to 32.9 in adult, unspecified obesity type   . Low serum HDL    Physical exam - discussed healthy lifestyle, diet, exercise, preventative care, vaccinations, and addressed their concerns.   See your dentist yearly for routine dental care including hygiene visits twice yearly. See your eye doctor yearly for routine vision care. Routine labs today. C/t efforts to lose weight and improve HDL throughout diet, exercise.   Vaccinations: None today.  Up to date on Tdap.  Recommended yearly flu shot. Follow up pending labs

## 2016-09-30 LAB — LIPID PANEL
CHOL/HDL RATIO: 3.6 ratio (ref <5.0)
CHOLESTEROL: 140 mg/dL (ref <200)
HDL: 39 mg/dL — AB (ref >40)
LDL Cholesterol: 84 mg/dL (ref <100)
TRIGLYCERIDES: 84 mg/dL (ref <150)
VLDL: 17 mg/dL (ref <30)

## 2016-09-30 LAB — BASIC METABOLIC PANEL
BUN: 12 mg/dL (ref 7–25)
CHLORIDE: 103 mmol/L (ref 98–110)
CO2: 22 mmol/L (ref 20–31)
Calcium: 9.2 mg/dL (ref 8.6–10.3)
Creat: 1.17 mg/dL (ref 0.60–1.35)
Glucose, Bld: 84 mg/dL (ref 65–99)
Potassium: 4.3 mmol/L (ref 3.5–5.3)
SODIUM: 140 mmol/L (ref 135–146)

## 2016-09-30 LAB — HEMOGLOBIN A1C
HEMOGLOBIN A1C: 5.1 % (ref <5.7)
MEAN PLASMA GLUCOSE: 100 mg/dL

## 2017-12-18 DIAGNOSIS — M542 Cervicalgia: Secondary | ICD-10-CM | POA: Diagnosis not present

## 2017-12-18 DIAGNOSIS — M5413 Radiculopathy, cervicothoracic region: Secondary | ICD-10-CM | POA: Diagnosis not present

## 2017-12-18 DIAGNOSIS — G43109 Migraine with aura, not intractable, without status migrainosus: Secondary | ICD-10-CM | POA: Diagnosis not present

## 2017-12-21 DIAGNOSIS — M5413 Radiculopathy, cervicothoracic region: Secondary | ICD-10-CM | POA: Diagnosis not present

## 2017-12-21 DIAGNOSIS — G43109 Migraine with aura, not intractable, without status migrainosus: Secondary | ICD-10-CM | POA: Diagnosis not present

## 2017-12-21 DIAGNOSIS — M542 Cervicalgia: Secondary | ICD-10-CM | POA: Diagnosis not present

## 2018-01-04 DIAGNOSIS — H04123 Dry eye syndrome of bilateral lacrimal glands: Secondary | ICD-10-CM | POA: Diagnosis not present

## 2018-01-04 DIAGNOSIS — H52221 Regular astigmatism, right eye: Secondary | ICD-10-CM | POA: Diagnosis not present

## 2018-01-04 DIAGNOSIS — G43109 Migraine with aura, not intractable, without status migrainosus: Secondary | ICD-10-CM | POA: Diagnosis not present

## 2018-01-04 DIAGNOSIS — M542 Cervicalgia: Secondary | ICD-10-CM | POA: Diagnosis not present

## 2018-01-04 DIAGNOSIS — H5213 Myopia, bilateral: Secondary | ICD-10-CM | POA: Diagnosis not present

## 2018-01-04 DIAGNOSIS — M5413 Radiculopathy, cervicothoracic region: Secondary | ICD-10-CM | POA: Diagnosis not present

## 2018-02-18 ENCOUNTER — Ambulatory Visit: Payer: Commercial Managed Care - PPO | Admitting: Medical

## 2018-02-18 ENCOUNTER — Encounter: Payer: Self-pay | Admitting: Medical

## 2018-02-18 VITALS — BP 106/78 | HR 91 | Temp 98.1°F | Ht 73.0 in | Wt 258.4 lb

## 2018-02-18 DIAGNOSIS — R1032 Left lower quadrant pain: Secondary | ICD-10-CM | POA: Diagnosis not present

## 2018-02-18 DIAGNOSIS — K5792 Diverticulitis of intestine, part unspecified, without perforation or abscess without bleeding: Secondary | ICD-10-CM

## 2018-02-18 LAB — CBC WITH DIFFERENTIAL/PLATELET
BASOS: 1 %
Basophils Absolute: 0.1 10*3/uL (ref 0.0–0.2)
EOS (ABSOLUTE): 0.4 10*3/uL (ref 0.0–0.4)
EOS: 3 %
Hematocrit: 42.4 % (ref 37.5–51.0)
Hemoglobin: 14.4 g/dL (ref 13.0–17.7)
IMMATURE GRANULOCYTES: 1 %
Immature Grans (Abs): 0.1 10*3/uL (ref 0.0–0.1)
LYMPHS: 14 %
Lymphocytes Absolute: 1.7 10*3/uL (ref 0.7–3.1)
MCH: 28.9 pg (ref 26.6–33.0)
MCHC: 34 g/dL (ref 31.5–35.7)
MCV: 85 fL (ref 79–97)
Monocytes Absolute: 1.1 10*3/uL — ABNORMAL HIGH (ref 0.1–0.9)
Monocytes: 9 %
Neutrophils Absolute: 8.7 10*3/uL — ABNORMAL HIGH (ref 1.4–7.0)
Neutrophils: 72 %
PLATELETS: 300 10*3/uL (ref 150–450)
RBC: 4.98 x10E6/uL (ref 4.14–5.80)
RDW: 12.1 % — AB (ref 12.3–15.4)
WBC: 12 10*3/uL — ABNORMAL HIGH (ref 3.4–10.8)

## 2018-02-18 MED ORDER — AMOXICILLIN-POT CLAVULANATE 875-125 MG PO TABS: 1.0000 | ORAL_TABLET | Freq: Two times a day (BID) | ORAL | 0 refills | Status: DC

## 2018-02-18 NOTE — Progress Notes (Signed)
Subjective: Chief Complaint  Patient presents with  . Diverticulitis    flare up x1 week    Here for diverticulitis flare up.   Been having LLQ abdominal discomfort, loose stool some, but no blood in stool.  Denies fever.   No nausea, no vomiting.   Has hx/o diverticulitis.  Diagnosed 2014 colonoscopy.  He [place] while in [place] in 2014 showing diverticulitis.   symptoms have persisted all weekend.  No urinary c/o.  No other aggravating or relieving factors.  No other complaint.  Past Medical History:  Diagnosis Date  . Allergy    RHINITIS  . Diverticulitis 03/2012   few occurrences as of 05/2015  . Farsightedness    wears contacts   No current outpatient medications on file prior to visit.   No current facility-administered medications on file prior to visit.    ROS as in subjective   Objective BP 106/78   Pulse 91   Temp 98.1 F (36.7 C) (Oral)   Ht 6\' 1"  (1.854 m)   Wt 258 lb 6.4 oz (117.2 kg)   SpO2 96%   BMI 34.09 kg/m   General appearance: alert, no distress, WD/WN,  Oral cavity: MMM, no lesions Heart: RRR, normal S1, S2, no murmurs Lungs: CTA bilaterally, no wheezes, rhonchi, or rales Abdomen: +increased bs, soft, non tender, non distended, no masses, no hepatomegaly, no splenomegaly Pulses: 2+ symmetric, upper and lower extremities, normal cap refill     Assessment: Encounter Diagnoses  Name Primary?  . Diverticulitis Yes  . Left lower quadrant abdominal pain      Plan: We discussed symptoms and concerns.  Begin Augmentin, labs today as below.  Referral to GI since this makes at least a third or fourth time he has had suspected diverticulitis in the last few years.  He reportedly had CT evidence of this back in 2014 but I do not have a copy of this.  If worse or not improving in the next few days call or return  Dean Carter was seen today for diverticulitis.  Diagnoses and all orders for this visit:  Diverticulitis -     CBC with  Differential/Platelet -     Ambulatory referral to Gastroenterology  Left lower quadrant abdominal pain -     CBC with Differential/Platelet -     Ambulatory referral to Gastroenterology  Other orders -     amoxicillin-clavulanate (AUGMENTIN) 875-125 MG tablet; Take 1 tablet by mouth 2 (two) times daily.

## 2018-03-22 ENCOUNTER — Ambulatory Visit: Payer: Commercial Managed Care - PPO | Admitting: Medical

## 2018-03-22 ENCOUNTER — Encounter: Payer: Self-pay | Admitting: Medical

## 2018-03-22 VITALS — BP 110/82 | HR 77 | Temp 97.9°F | Ht 73.0 in | Wt 265.0 lb

## 2018-03-22 DIAGNOSIS — R7301 Impaired fasting glucose: Secondary | ICD-10-CM

## 2018-03-22 DIAGNOSIS — E669 Obesity, unspecified: Secondary | ICD-10-CM | POA: Diagnosis not present

## 2018-03-22 DIAGNOSIS — H0520 Unspecified exophthalmos: Secondary | ICD-10-CM | POA: Insufficient documentation

## 2018-03-22 DIAGNOSIS — Z0000 Encounter for general adult medical examination without abnormal findings: Secondary | ICD-10-CM | POA: Diagnosis not present

## 2018-03-22 DIAGNOSIS — Z2821 Immunization not carried out because of patient refusal: Secondary | ICD-10-CM | POA: Diagnosis not present

## 2018-03-22 DIAGNOSIS — Z6832 Body mass index (BMI) 32.0-32.9, adult: Secondary | ICD-10-CM

## 2018-03-22 DIAGNOSIS — K5792 Diverticulitis of intestine, part unspecified, without perforation or abscess without bleeding: Secondary | ICD-10-CM | POA: Insufficient documentation

## 2018-03-22 LAB — POCT URINALYSIS DIP (PROADVANTAGE DEVICE)
BILIRUBIN UA: NEGATIVE
Blood, UA: NEGATIVE
GLUCOSE UA: NEGATIVE mg/dL
Ketones, POC UA: NEGATIVE mg/dL
LEUKOCYTES UA: NEGATIVE
NITRITE UA: NEGATIVE
PH UA: 6 (ref 5.0–8.0)
Protein Ur, POC: NEGATIVE mg/dL
Specific Gravity, Urine: 1.025
Urobilinogen, Ur: NEGATIVE

## 2018-03-22 NOTE — Progress Notes (Signed)
Subjective:   HPI  Dean Carter is a 33 y.o. male who presents for a complete physical.   Concerns: none   Reviewed their medical, surgical, family, social, medication, and allergy history and updated chart as appropriate.  Past Medical History:  Diagnosis Date  . Allergy    RHINITIS  . Diverticulitis 03/2012   few occurrences as of 05/2015  . Farsightedness    wears contacts    Past Surgical History:  Procedure Laterality Date  . APPENDECTOMY    . WISDOM TOOTH EXTRACTION      Social History   Socioeconomic History  . Marital status: Single    Spouse name: Not on file  . Number of children: Not on file  . Years of education: Not on file  . Highest education level: Not on file  Occupational History  . 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 Financial resource strain: Not on file  . Food insecurity:    Worry: Not on file    Inability: Not on file  . Transportation needs:    Medical: Not on file    Non-medical: Not on file  Tobacco Use  . Smoking status: Never Smoker  . Smokeless tobacco: Never Used  Substance and Sexual Activity  . Alcohol use: Yes    Alcohol/week: 5.0 standard drinks    Types: 3 Cans of beer, 2 Standard drinks or equivalent per week  . Drug use: No  . 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 Physical activity:    Days per week: Not on file    Minutes per session: Not on file  . Stress: Not on file  Relationships  . Social connections:    Talks on phone: Not on file    Gets together: Not on file    Attends religious service: Not on file    Active member of club or organization: Not on file    Attends meetings of clubs or organizations: Not on file    Relationship status: Not on file  . Intimate partner violence:    Fear of current or ex partner: Not on file     Emotionally abused: Not on file    Physically abused: Not on file    Forced sexual activity: Not on file  Other Topics Concern  . Not on file  Social History Narrative   Single, no current partner, no children, exercise - plays some on basketball team, but not a lot of late.   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      Family History  Problem Relation Age of Onset  . Diabetes Mother        TYPE 2  . Arthritis Father   . Heart disease Paternal Uncle        CABG  . Stroke Maternal Grandmother   . Heart disease Paternal Grandfather        CABG  . Cancer Neg Hx   . Hypertension Neg Hx   . Hyperlipidemia Neg Hx     No current outpatient medications on file.  No Known Allergies  Review of Systems Constitutional: -fever, -chills, -sweats, -unexpected weight change, -decreased appetite, -fatigue Allergy: +sneezing, -itching, -congestion Dermatology: -changing moles, --rash, -lumps ENT: -runny nose, -ear pain, -sore throat, -hoarseness, -sinus pain, -teeth pain, - ringing in ears, -hearing  loss, -nosebleeds Cardiology: -chest pain, -palpitations, -swelling, -difficulty breathing when lying flat, -waking up short of breath Respiratory: -cough, -shortness of breath, -difficulty breathing with exercise or exertion, -wheezing, -coughing up blood Gastroenterology: -abdominal pain, -nausea, -vomiting, -diarrhea, -constipation, -blood in stool, -changes in bowel movement, -difficulty swallowing or eating Hematology: -bleeding, -bruising  Musculoskeletal: -joint aches, -muscle aches, -joint swelling, -back pain, -neck pain, -cramping, -changes in gait Ophthalmology: denies vision changes, eye redness, itching, discharge Urology: -burning with urination, -difficulty urinating, -blood in urine, -urinary frequency, -urgency, -incontinence Neurology: -headache, -weakness, -tingling, -numbness, -memory loss, -falls, -dizziness Psychology:  -depressed mood, -agitation, -sleep problems     Objective:   Physical Exam  BP 110/82   Pulse 77   Temp 97.9 F (36.6 C) (Oral)   Ht 6\' 1"  (1.854 m)   Wt 265 lb (120.2 kg)   SpO2 97%   BMI 34.96 kg/m   Wt Readings from Last 3 Encounters:  03/22/18 265 lb (120.2 kg)  02/18/18 258 lb 6.4 oz (117.2 kg)  09/29/16 241 lb (109.3 kg)     General appearance: alert, no distress, WD/WN, white male, obesity Skin: unremarkable, few scattered benign appearing macules, sun tattoo upper back, 2 notable macules include right lower back with 8 mm x 4 mm flat brown benign-appearing macule triangular-shaped, right low back with 8 mm x 3 mm round flat brown macule HEENT: normocephalic, conjunctiva/corneas normal, sclerae anicteric, PERRLA, EOMi, nares patent, no discharge or erythema, pharynx normal  Oral cavity: MMM, tongue normal, teeth normal  Neck: supple, no lymphadenopathy, no thyromegaly, no masses, normal ROM, no bruits  Chest: non tender, normal shape and expansion  Heart: RRR, normal S1, S2, no murmurs  Lungs: CTA bilaterally, no wheezes, rhonchi, or rales  Abdomen: +bs, soft, non tender, non distended, no masses, no hepatomegaly, no splenomegaly, no bruits  Back: non tender, normal ROM, no scoliosis  Musculoskeletal: upper extremities non tender, no obvious deformity, normal ROM throughout, lower extremities non tender, no obvious deformity, normal ROM throughout  Extremities: no edema, no cyanosis, no clubbing  Pulses: 2+ symmetric, upper and lower extremities, normal cap refill  Neurological: alert, oriented x 3, CN2-12 intact, strength normal upper extremities and lower extremities, sensation normal throughout, DTRs 2+ throughout, no cerebellar signs, gait normal  Psychiatric: normal affect, behavior normal, pleasant  GU: normal external male genitalia, circumcised, mild varicocele left, no nodules, mass, hernias      Assessment and Plan :    Encounter Diagnoses   Name Primary?  . Routine general medical examination at a health care facility Yes  . Class 1 obesity without serious comorbidity with body mass index (BMI) of 32.0 to 32.9 in adult, unspecified obesity type   . Impaired fasting blood sugar   . Influenza vaccination declined   . Diverticulitis   . Exophthalmos      Physical exam - discussed healthy lifestyle, diet, exercise, preventative care, vaccinations, and addressed their concerns.   See your dentist yearly for routine dental care including hygiene visits twice yearly. See your eye doctor yearly for routine vision care. Routine labs today. C/t efforts to lose weight and improve HDL throughout diet, exercise.   Vaccinations: None today.  Up to date on Tdap.  Recommended yearly flu shot. He never heard back about GI referral regarding recent diverticulitis.   We will check on referral exophthalmos on exam - thyroid lab today Follow up pending labs

## 2018-03-23 LAB — COMPREHENSIVE METABOLIC PANEL
A/G RATIO: 1.9 (ref 1.2–2.2)
ALT: 53 IU/L — AB (ref 0–44)
AST: 25 IU/L (ref 0–40)
Albumin: 5 g/dL (ref 3.5–5.5)
Alkaline Phosphatase: 48 IU/L (ref 39–117)
BILIRUBIN TOTAL: 1 mg/dL (ref 0.0–1.2)
BUN / CREAT RATIO: 11 (ref 9–20)
BUN: 12 mg/dL (ref 6–20)
CHLORIDE: 99 mmol/L (ref 96–106)
CO2: 25 mmol/L (ref 20–29)
Calcium: 9.8 mg/dL (ref 8.7–10.2)
Creatinine, Ser: 1.13 mg/dL (ref 0.76–1.27)
GFR calc non Af Amer: 85 mL/min/{1.73_m2} (ref >59)
GFR, EST AFRICAN AMERICAN: 98 mL/min/{1.73_m2} (ref >59)
Globulin, Total: 2.6 g/dL (ref 1.5–4.5)
Glucose: 80 mg/dL (ref 65–99)
POTASSIUM: 4.2 mmol/L (ref 3.5–5.2)
Sodium: 139 mmol/L (ref 134–144)
TOTAL PROTEIN: 7.6 g/dL (ref 6.0–8.5)

## 2018-03-23 LAB — CBC
HEMOGLOBIN: 15.6 g/dL (ref 13.0–17.7)
Hematocrit: 47 % (ref 37.5–51.0)
MCH: 28.8 pg (ref 26.6–33.0)
MCHC: 33.2 g/dL (ref 31.5–35.7)
MCV: 87 fL (ref 79–97)
Platelets: 263 10*3/uL (ref 150–450)
RBC: 5.42 x10E6/uL (ref 4.14–5.80)
RDW: 13.3 % (ref 11.6–15.4)
WBC: 6.8 10*3/uL (ref 3.4–10.8)

## 2018-03-23 LAB — LIPID PANEL
CHOLESTEROL TOTAL: 187 mg/dL (ref 100–199)
Chol/HDL Ratio: 4.7 ratio (ref 0.0–5.0)
HDL: 40 mg/dL (ref >39)
LDL Calculated: 122 mg/dL — ABNORMAL HIGH (ref 0–99)
TRIGLYCERIDES: 125 mg/dL (ref 0–149)
VLDL Cholesterol Cal: 25 mg/dL (ref 5–40)

## 2018-03-23 LAB — HEMOGLOBIN A1C
Est. average glucose Bld gHb Est-mCnc: 114 mg/dL
Hgb A1c MFr Bld: 5.6 % (ref 4.8–5.6)

## 2018-03-23 LAB — TSH: TSH: 1.19 u[IU]/mL (ref 0.450–4.500)

## 2018-03-27 ENCOUNTER — Encounter: Payer: Self-pay | Admitting: Gastroenterology

## 2018-04-12 ENCOUNTER — Ambulatory Visit: Payer: Commercial Managed Care - PPO | Admitting: Gastroenterology

## 2018-04-12 ENCOUNTER — Encounter: Payer: Self-pay | Admitting: Gastroenterology

## 2018-04-12 VITALS — BP 110/82 | HR 90 | Ht 73.0 in | Wt 268.1 lb

## 2018-04-12 DIAGNOSIS — K5732 Diverticulitis of large intestine without perforation or abscess without bleeding: Secondary | ICD-10-CM | POA: Diagnosis not present

## 2018-04-12 DIAGNOSIS — R740 Nonspecific elevation of levels of transaminase and lactic acid dehydrogenase [LDH]: Secondary | ICD-10-CM

## 2018-04-12 DIAGNOSIS — R7401 Elevation of levels of liver transaminase levels: Secondary | ICD-10-CM

## 2018-04-12 MED ORDER — SOD PICOSULFATE-MAG OX-CIT ACD 10-3.5-12 MG-GM -GM/160ML PO SOLN: 1.0000 | ORAL | 0 refills | Status: DC

## 2018-04-12 NOTE — Progress Notes (Signed)
Chief Complaint: Recurrent diverticulitis  Referring Provider:     Jac Canavan, PA-C  HPI:    Sig Clisham is a 33 y.o. male with a history of diverticulosis referred to [facility] for evaluation of recent episode of diverticulitis.  Reports that he has had 3-4 episodes of diverticulitis since 2014, with index episode of diverticulitis diagnosed on CT while in [place] (was working there) in 2014.  Was seen by his PCM on 02/18/2018 for LLQ pain, loose stools x1 week, without fever or hematochezia.  Was diagnosed with mild diverticulitis and started on Augmentin and referral to GI.  CBC notable for mild leukocytosis (WBC 12) and otherwise normal.  Was seen in follow-up by his PCM on 03/22/2018 with resolution of symptoms and normalization of WBC to 6.8.  Those labs were n/f mildly elevated ALT at 53 (previously 34) with otherwise normal CMP (AST 25).  A1c 5.6.  No recent imaging for review.  Today he states he is without any complaints.  Symptoms have resolved and tolerating all p.o. intake without any abdominal pain or change in bowel habits.  No hematochezia or melena.  Completed antibiotics as prescribed.  No history of admissions for diverticulitis or need for IV antibiotics in the past.  No known family history of CRC, GI malignancy, liver disease, pancreatic disease, or IBD.   No previous EGD or Colonoscopy   Past Medical History:  Diagnosis Date  . Allergy    RHINITIS  . Diverticulitis 03/2012   few occurrences as of 05/2015  . Farsightedness    wears contacts     Past Surgical History:  Procedure Laterality Date  . APPENDECTOMY    . WISDOM TOOTH EXTRACTION     Family History  Problem Relation Age of Onset  . Diabetes Mother        TYPE 2  . Arthritis Father   . Heart disease Paternal Uncle        CABG  . Stroke Maternal Grandmother   . Heart disease Paternal Grandfather        CABG  . Cancer Neg Hx   . Hypertension Neg Hx   .  Hyperlipidemia Neg Hx    Social History   Tobacco Use  . Smoking status: Never Smoker  . Smokeless tobacco: Never Used  Substance Use Topics  . Alcohol use: Yes    Alcohol/week: 5.0 standard drinks    Types: 3 Cans of beer, 2 Standard drinks or equivalent per week  . Drug use: No   No current outpatient medications on file.   No current facility-administered medications for this visit.    No Known Allergies   Review of Systems: All systems reviewed and negative except where noted in HPI.     Physical Exam:    Wt Readings from Last 3 Encounters:  04/12/18 268 lb 2 oz (121.6 kg)  03/22/18 265 lb (120.2 kg)  02/18/18 258 lb 6.4 oz (117.2 kg)    Ht 6\' 1"  (1.854 m)   Wt 268 lb 2 oz (121.6 kg)   BMI 35.37 kg/m  Constitutional:  Pleasant, in no acute distress. Psychiatric: Normal mood and affect. Behavior is normal. EENT: Pupils normal.  Conjunctivae are normal. No scleral icterus. Neck supple. No cervical LAD. Cardiovascular: Normal rate, regular rhythm. No edema Pulmonary/chest: Effort normal and breath sounds normal. No wheezing, rales or rhonchi. Abdominal: Soft, nondistended, nontender. Bowel sounds active throughout. There are  no masses palpable. No hepatomegaly. Neurological: Alert and oriented to person place and time. Skin: Skin is warm and dry. No rashes noted.   ASSESSMENT AND PLAN;   Bethanie DickerRobert Birchler is a 33 y.o. male presenting with:  1) Diverticular disease complicated by diverticulitis: - Colonoscopy; scheduled 8 weeks post diverticulitis episode - Increased dietary fiber  2) Elevated ALT: Elevated ALT with normal AST, ALP, T bili.  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 However, we also discussed other etiologies to include medications, and concomitant liver diseases, we  discussed further serologic evaluation versus treatment/observation as outlined below and patient elected the following:  - Dietary modifications and increased exercise - Repeat LAEs in 6 months. If still elevated, further eval with RUQ US and extended serologic eval as appropriate  The indications, risks, and benefits of colonoscopy were explained to the patient in detail. Risks include but are not limited to bleeding, perforation, adverse reaction to medications, and cardiopulmonary compromise. Sequelae include but are not limited to the possibility of surgery, hospitalization, and mortality. The patient verbalized understanding and wished to proceed. All questions answered, referred to the scheduler and bowel prep ordered. Further recommendations pending results of the exam.   Shellia CleverlyVito V Taina Landry, DO, FACG  04/12/2018, 8:33 AM   Tysinger, Kermit Baloavid S, PA-C

## 2018-04-12 NOTE — Patient Instructions (Signed)
If you are age 34 or older, your body mass index should be between 23-30. Your Body mass index is 35.37 kg/m. If this is out of the aforementioned range listed, please consider follow up with your Primary Care Provider.  If you are age 77 or younger, your body mass index should be between 19-25. Your Body mass index is 35.37 kg/m. If this is out of the aformentioned range listed, please consider follow up with your Primary Care Provider.   You have been scheduled for a colonoscopy. Please follow written instructions given to you at your visit today.  Please pick up your prep supplies at the pharmacy within the next 1-3 days. If you use inhalers (even only as needed), please bring them with you on the day of your procedure. Your physician has requested that you go to www.startemmi.com and enter the access code given to you at your visit today. This web site gives a general overview about your procedure. However, you should still follow specific instructions given to you by our office regarding your preparation for the procedure.  We have sent the following medications to your pharmacy for you to pick up at your convenience: Clenpiq  It was a pleasure to see you today!  Vito Cirigliano, D.O.

## 2018-04-15 ENCOUNTER — Encounter: Payer: Self-pay | Admitting: Gastroenterology

## 2018-04-29 ENCOUNTER — Encounter: Payer: Self-pay | Admitting: Gastroenterology

## 2018-04-29 ENCOUNTER — Ambulatory Visit (AMBULATORY_SURGERY_CENTER): Payer: Commercial Managed Care - PPO | Admitting: Gastroenterology

## 2018-04-29 VITALS — BP 115/67 | HR 71 | Temp 98.6°F | Resp 9 | Ht 73.0 in | Wt 268.0 lb

## 2018-04-29 DIAGNOSIS — K5732 Diverticulitis of large intestine without perforation or abscess without bleeding: Secondary | ICD-10-CM | POA: Diagnosis not present

## 2018-04-29 DIAGNOSIS — K5730 Diverticulosis of large intestine without perforation or abscess without bleeding: Secondary | ICD-10-CM

## 2018-04-29 MED ORDER — SODIUM CHLORIDE 0.9 % IV SOLN: 500.0000 mL | Freq: Once | INTRAVENOUS | Status: DC

## 2018-04-29 NOTE — Progress Notes (Signed)
PT taken to PACU. Monitors in place. VSS. Report given to RN. 

## 2018-04-29 NOTE — Patient Instructions (Signed)
YOU HAD AN ENDOSCOPIC PROCEDURE TODAY AT THE [facility]:   Refer to the procedure report that was given to you for any specific questions about what was found during the examination.  If the procedure report does not answer your questions, please call your gastroenterologist to clarify.  If you requested that your care partner not be given the details of your procedure findings, then the procedure report has been included in a sealed envelope for you to review at your convenience later.  YOU SHOULD EXPECT: Some feelings of bloating in the abdomen. Passage of more gas than usual.  Walking can help get rid of the air that was put into your GI tract during the procedure and reduce the bloating. If you had a lower endoscopy (such as a colonoscopy or flexible sigmoidoscopy) you may notice spotting of blood in your stool or on the toilet paper. If you underwent a bowel prep for your procedure, you may not have a normal bowel movement for a few days.  Please Note:  You might notice some irritation and congestion in your nose or some drainage.  This is from the oxygen used during your procedure.  There is no need for concern and it should clear up in a day or so.  SYMPTOMS TO REPORT IMMEDIATELY:   Following lower endoscopy (colonoscopy or flexible sigmoidoscopy):  Excessive amounts of blood in the stool  Significant tenderness or worsening of abdominal pains  Swelling of the abdomen that is new, acute  Fever of 100F or higher  For urgent or emergent issues, a gastroenterologist can be reached at any hour by calling [phone].   DIET:  We do recommend a small meal at first, but then you may proceed to your regular diet.  Drink plenty of fluids but you should avoid alcoholic beverages for 24 hours.  ACTIVITY:  You should plan to take it easy for the rest of today and you should NOT DRIVE or use heavy machinery until tomorrow (because of the sedation medicines used during the test).     FOLLOW UP: Our staff will call the number listed on your records the next business day following your procedure to check on you and address any questions or concerns that you may have regarding the information given to you following your procedure. If we do not reach you, we will leave a message.  However, if you are feeling well and you are not experiencing any problems, there is no need to return our call.  We will assume that you have returned to your regular daily activities without incident.  If any biopsies were taken you will be contacted by phone or by letter within the next 1-3 weeks.  Please call us at [phone] if you have not heard about the biopsies in 3 weeks.    SIGNATURES/CONFIDENTIALITY: You and/or your care partner have signed paperwork which will be entered into your electronic medical record.  These signatures attest to the fact that that the information above on your After Visit Summary has been reviewed and is understood.  Full responsibility of the confidentiality of this discharge information lies with you and/or your care-partner. 

## 2018-04-29 NOTE — Op Note (Signed)
[facility] Patient Name: Dean Carter Procedure Date: 04/29/2018 2:24 PM MRN: [id number] Endoscopist: Doristine Locks , MD Age: 33 Referring MD:  Date of Birth: 1985/06/24 Gender: Male Account #: [id number] Procedure:                Colonoscopy Indications:              This is the patient's first colonoscopy, Follow-up                            of diverticulitis                           Recently diagnosed with diverticulitis in December                            2019, appropriately treated with antibiotics, with                            resolution of index symptoms. He presents today for                            endoscopic evaluation. No current lower GI symptoms. Medicines:                Monitored Anesthesia Care Procedure:                Pre-Anesthesia Assessment:                           - Prior to the procedure, a History and Physical                            was performed, and patient medications and                            allergies were reviewed. The patient's tolerance of                            previous anesthesia was also reviewed. The risks                            and benefits of the procedure and the sedation                            options and risks were discussed with the patient.                            All questions were answered, and informed consent                            was obtained. Prior Anticoagulants: The patient has                            taken no previous anticoagulant or antiplatelet  agents. ASA Grade Assessment: II - A patient with                            mild systemic disease. After reviewing the risks                            and benefits, the patient was deemed in                            satisfactory condition to undergo the procedure.                           After obtaining informed consent, the colonoscope                            was passed under direct vision.  Throughout the                            procedure, the patient's blood pressure, pulse, and                            oxygen saturations were monitored continuously. The                            Colonoscope was introduced through the anus and                            advanced to the the terminal ileum. The colonoscopy                            was performed without difficulty. The patient                            tolerated the procedure well. The quality of the                            bowel preparation was adequate. The terminal ileum,                            ileocecal valve, appendiceal orifice, and rectum                            were photographed. Scope In: 2:42:47 PM Scope Out: 2:58:28 PM Scope Withdrawal Time: 0 hours 12 minutes 40 seconds  Total Procedure Duration: 0 hours 15 minutes 41 seconds  Findings:                 The perianal and digital rectal examinations were                            normal.                           Multiple small and large-mouthed diverticula were  found in the sigmoid colon and ascending colon.                           A localized area of mildly erythematous mucosa was                            found in the sigmoid colon, located 30-40 cm from                            the anal verge, in an area of diverticulosis. This                            was biopsied with a cold forceps for histology.                            Estimated blood loss was minimal.                           The rectum, descending colon, transverse colon,                            hepatic flexure, ascending colon and cecum appeared                            normal.                           The retroflexed view of the distal rectum and anal                            verge was normal and showed no anal or rectal                            abnormalities.                           The terminal ileum appeared normal. Complications:             No immediate complications. Estimated Blood Loss:     Estimated blood loss was minimal. Impression:               - Diverticulosis in the sigmoid colon and in the                            ascending colon.                           - Erythematous mucosa in the sigmoid colon, located                            30-40 cm from the anal verge. Biopsied.                           - The rectum, descending colon, transverse colon,  hepatic flexure, ascending colon and cecum are                            normal.                           - The distal rectum and anal verge are normal on                            retroflexion view.                           - The examined portion of the ileum was normal. Recommendation:           - Patient has a contact number available for                            emergencies. The signs and symptoms of potential                            delayed complications were discussed with the                            patient. Return to normal activities tomorrow.                            Written discharge instructions were provided to the                            patient.                           - Resume previous diet today.                           - Continue present medications.                           - Await pathology results.                           - Repeat colonoscopy at age 84 for screening                            purposes.                           - Return to GI office PRN. Doristine Locks, MD 04/29/2018 3:06:07 PM

## 2018-04-29 NOTE — Progress Notes (Signed)
Called to room to assist during endoscopic procedure.  Patient ID and intended procedure confirmed with present staff. Received instructions for my participation in the procedure from the performing physician.  

## 2018-04-30 ENCOUNTER — Telehealth: Payer: Self-pay

## 2018-04-30 NOTE — Telephone Encounter (Signed)
Left message on answering machine. 

## 2018-04-30 NOTE — Telephone Encounter (Signed)
Second follow up phone call attempt, no answer, message left 

## 2018-05-01 ENCOUNTER — Encounter: Payer: Self-pay | Admitting: Gastroenterology

## 2018-07-19 ENCOUNTER — Ambulatory Visit: Payer: Self-pay | Admitting: Medical

## 2018-07-22 ENCOUNTER — Ambulatory Visit: Payer: Self-pay | Admitting: Medical

## 2018-11-27 ENCOUNTER — Ambulatory Visit: Payer: Commercial Managed Care - PPO | Admitting: Medical

## 2018-11-27 ENCOUNTER — Encounter: Payer: Self-pay | Admitting: Medical

## 2018-11-27 ENCOUNTER — Other Ambulatory Visit: Payer: Self-pay

## 2018-11-27 VITALS — BP 120/84 | HR 106 | Temp 97.5°F | Ht 73.0 in | Wt 263.8 lb

## 2018-11-27 DIAGNOSIS — Z1159 Encounter for screening for other viral diseases: Secondary | ICD-10-CM | POA: Diagnosis not present

## 2018-11-27 DIAGNOSIS — R945 Abnormal results of liver function studies: Secondary | ICD-10-CM

## 2018-11-27 DIAGNOSIS — R7989 Other specified abnormal findings of blood chemistry: Secondary | ICD-10-CM

## 2018-11-27 DIAGNOSIS — K5792 Diverticulitis of intestine, part unspecified, without perforation or abscess without bleeding: Secondary | ICD-10-CM

## 2018-11-27 MED ORDER — AMOXICILLIN-POT CLAVULANATE 875-125 MG PO TABS: 1.0000 | ORAL_TABLET | Freq: Two times a day (BID) | ORAL | 0 refills | Status: DC

## 2018-11-27 NOTE — Progress Notes (Signed)
Subjective: Chief Complaint  Patient presents with  . Abdominal Pain    possibel diverticulitis   . Testicle Pain   Here for possible diverticulitis.  Has hx/o diverticulitis.   Saw GI back earlier this year and had colonoscopy proving diverticular disease.   This current episode started in the middle of night couldn't sleep due to pain.  Started having lower abdominal pain x 24 hours.  Was in normal state of health prior to yesterday.  No testicular swelling.  Has feeling of being kicked in gonads.   No concern for STD, monogamous.   No urine odor, no blood.   No blood in stool.   No loose stool the last few days.   Takes a vitamin daily, hydrate well daily.  He ate some peanuts a few days ago when he was camping.   No other aggravating or relieving factors. No other complaint.   Past Medical History:  Diagnosis Date  . Allergy    RHINITIS  . Diverticulitis 03/2012   few occurrences as of 05/2015  . Farsightedness    wears contacts   Current Outpatient Medications on File Prior to Visit  Medication Sig Dispense Refill  . Multiple Vitamin (MULTIVITAMIN) tablet Take 1 tablet by mouth daily.     No current facility-administered medications on file prior to visit.    ROS as in subjective   Objective: BP 120/84   Pulse (!) 106   Temp (!) 97.5 F (36.4 C)   Ht 6\' 1"  (1.854 m)   Wt 263 lb 12.8 oz (119.7 kg)   SpO2 96%   BMI 34.80 kg/m    Wt Readings from Last 3 Encounters:  11/27/18 263 lb 12.8 oz (119.7 kg)  04/29/18 268 lb (121.6 kg)  04/12/18 268 lb 2 oz (121.6 kg)   Gen: wd, WN, and Abdomen: +bs, soft, tender LLQ, otherwise no guarding, no rebound , no distention, no organomegaly, no mass.  Back: nontender     Assessment: Encounter Diagnoses  Name Primary?  . Acute diverticulitis Yes  . Elevated LFTs   . Screening for viral disease      Plan: Etiology likely acute diverticulitis - begin Augmentin, bowel rest, hydration, OTC analgesia.  If not improving or  worse symptoms in the next 72 hours, then recheck  I reviewed his 04/2018 GI notes and colonoscopy report, + for diverticulosis and erythema in part of colon suggestive diverticulitis.   Elevated LFTs - repeat LFTs and forward copy to GI  He requested covid antibiotic test to see if he has been exposed without illness.  Dean Carter was seen today for abdominal pain and testicle pain.  Diagnoses and all orders for this visit:  Acute diverticulitis -     Hepatic function panel -     CBC with Differential/Platelet  Elevated LFTs -     Hepatic function panel -     CBC with Differential/Platelet  Screening for viral disease -     SAR CoV2 Serology (COVID 19)AB(IGG)IA  Other orders -     amoxicillin-clavulanate (AUGMENTIN) 875-125 MG tablet; Take 1 tablet by mouth 2 (two) times daily.

## 2018-11-27 NOTE — Patient Instructions (Signed)

## 2018-11-28 LAB — CBC WITH DIFFERENTIAL/PLATELET
Basophils Absolute: 0.1 10*3/uL (ref 0.0–0.2)
Basos: 1 %
EOS (ABSOLUTE): 0.2 10*3/uL (ref 0.0–0.4)
Eos: 1 %
Hematocrit: 48.8 % (ref 37.5–51.0)
Hemoglobin: 16.1 g/dL (ref 13.0–17.7)
Immature Grans (Abs): 0 10*3/uL (ref 0.0–0.1)
Immature Granulocytes: 0 %
Lymphocytes Absolute: 1.4 10*3/uL (ref 0.7–3.1)
Lymphs: 11 %
MCH: 28.7 pg (ref 26.6–33.0)
MCHC: 33 g/dL (ref 31.5–35.7)
MCV: 87 fL (ref 79–97)
Monocytes Absolute: 1.2 10*3/uL — ABNORMAL HIGH (ref 0.1–0.9)
Monocytes: 9 %
Neutrophils Absolute: 10.5 10*3/uL — ABNORMAL HIGH (ref 1.4–7.0)
Neutrophils: 78 %
Platelets: 294 10*3/uL (ref 150–450)
RBC: 5.61 x10E6/uL (ref 4.14–5.80)
RDW: 13.3 % (ref 11.6–15.4)
WBC: 13.3 10*3/uL — ABNORMAL HIGH (ref 3.4–10.8)

## 2018-11-28 LAB — HEPATIC FUNCTION PANEL
ALT: 36 IU/L (ref 0–44)
AST: 22 IU/L (ref 0–40)
Albumin: 5 g/dL (ref 4.0–5.0)
Alkaline Phosphatase: 59 IU/L (ref 39–117)
Bilirubin Total: 0.9 mg/dL (ref 0.0–1.2)
Bilirubin, Direct: 0.22 mg/dL (ref 0.00–0.40)
Total Protein: 7.3 g/dL (ref 6.0–8.5)

## 2018-11-28 LAB — EUROIMMUN SARS-COV-2 AB, IGG: Euroimmun SARS-CoV-2 Ab, IgG: NEGATIVE

## 2018-11-28 LAB — SAR COV2 SEROLOGY (COVID19)AB(IGG),IA

## 2018-12-24 ENCOUNTER — Other Ambulatory Visit: Payer: Self-pay | Admitting: Medical

## 2018-12-24 ENCOUNTER — Telehealth: Payer: Self-pay | Admitting: Medical

## 2018-12-24 MED ORDER — AMOXICILLIN-POT CLAVULANATE 875-125 MG PO TABS: 1.0000 | ORAL_TABLET | Freq: Two times a day (BID) | ORAL | 0 refills | Status: DC

## 2018-12-24 NOTE — Telephone Encounter (Signed)
Medication sent.  I recommend he also contact his gastroenterology office to let them know about the flareup

## 2018-12-24 NOTE — Telephone Encounter (Signed)
Pt called and stated that he was having a flare up of his diverticulitis again and wants to see if he can get a refill of the medication that was prescribed to him last time. Pt uses the [facility] on [address].

## 2019-03-10 ENCOUNTER — Other Ambulatory Visit: Payer: Self-pay

## 2019-03-10 ENCOUNTER — Telehealth: Payer: Self-pay

## 2019-03-10 ENCOUNTER — Ambulatory Visit: Payer: Commercial Managed Care - PPO | Admitting: Medical

## 2019-03-10 ENCOUNTER — Telehealth: Payer: Self-pay | Admitting: Medical

## 2019-03-10 ENCOUNTER — Encounter: Payer: Self-pay | Admitting: Medical

## 2019-03-10 VITALS — Ht 73.0 in | Wt 255.0 lb

## 2019-03-10 DIAGNOSIS — K5732 Diverticulitis of large intestine without perforation or abscess without bleeding: Secondary | ICD-10-CM | POA: Insufficient documentation

## 2019-03-10 DIAGNOSIS — R1032 Left lower quadrant pain: Secondary | ICD-10-CM | POA: Diagnosis not present

## 2019-03-10 DIAGNOSIS — R6883 Chills (without fever): Secondary | ICD-10-CM | POA: Diagnosis not present

## 2019-03-10 MED ORDER — HYDROCODONE-ACETAMINOPHEN 5-325 MG PO TABS: 1.0000 | ORAL_TABLET | Freq: Four times a day (QID) | ORAL | 0 refills | Status: DC | PRN

## 2019-03-10 MED ORDER — METRONIDAZOLE 500 MG PO TABS: 500.0000 mg | ORAL_TABLET | Freq: Three times a day (TID) | ORAL | 0 refills | Status: DC

## 2019-03-10 MED ORDER — CIPROFLOXACIN HCL 500 MG PO TABS: 500.0000 mg | ORAL_TABLET | Freq: Two times a day (BID) | ORAL | 0 refills | Status: DC

## 2019-03-10 NOTE — Telephone Encounter (Signed)
Spoke to patient to schedule follow up appointment for ongoing  diverticulitis flare ups. He will come in on 03/19/19.

## 2019-03-10 NOTE — Telephone Encounter (Signed)
Refer back to GI, Dr. Barron Alvine for recurrent diverticulitis, 4+ times in the past 12 months.   If we can't get him in within 10-14 days for consult then great.   If it will be longer than 10-14 days before appointment, then lets set up for CT abdomen/pelvis.

## 2019-03-10 NOTE — Progress Notes (Signed)
This visit type was conducted due to national recommendations for restrictions regarding the COVID-19 Pandemic (e.g. social distancing) in an effort to limit this patient's exposure and mitigate transmission in our community.  Due to their co-morbid illnesses, this patient is at least at moderate risk for complications without adequate follow up.  This format is felt to be most appropriate for this patient at this time.    Documentation for virtual audio and video telecommunications through Zoom encounter:  The patient was located at home. The provider was located in the office. The patient did consent to this visit and is aware of possible charges through their insurance for this visit.  The other persons participating in this telemedicine service were none. Time spent on call was 20 minutes and in review of previous records >20 minutes total.  This virtual service is not related to other E/M service within previous 7 days.  Subjective: Chief Complaint  Patient presents with  . Diverticulitis    flare up   Virtual consult today for diverticulitis flare.  He has had several flares in the past 12 months.  He has had a few flares that lasted 1 or 2 days and then resolved on its own.  But lately flareups have been more frequent including this current one.  He notes lately having more frequent LLQ abdominal pain, having some cold sweats last night, chills last night.  No nausea, no vomiting.  No fever.  Last BM today, seemed ok.  Been having daily BM.  No blood in the stool.   No urinary issues.   No testicular pain.  Last CT scan was in [place] a few years ago when he got diagnosed initially.  He saw gastroenterology in February 2020 including colonoscopy.  No other aggravating or relieving factors. No other complaint.   Past Medical History:  Diagnosis Date  . Allergy    RHINITIS  . Diverticulitis 03/2012   few occurrences as of 05/2015  . Farsightedness    wears contacts   Current  Outpatient Medications on File Prior to Visit  Medication Sig Dispense Refill  . Multiple Vitamin (MULTIVITAMIN) tablet Take 1 tablet by mouth daily.     No current facility-administered medications on file prior to visit.   ROS as in subjective   Objective: Ht 6\' 1"  (1.854 m)   Wt 255 lb (115.7 kg)   BMI 33.64 kg/m   Not examined in person as this was a virtual consult    Assessment: Encounter Diagnoses  Name Primary?  . Diverticulitis of large intestine without bleeding, unspecified complication status Yes  . LLQ abdominal pain   . Chills   . Left lower quadrant abdominal pain     Plan: We discussed concerns and symptoms.  Begin antibiotic below, pain medicine as needed.  He may return for labs to get ready for CT scan.  This depends on how quickly we can get him back into GI for updated consult.  He has had several flareups in the past 12 months.  I reviewed his February 2020 colonoscopy report showing diverticulitis.  I do not have his original CT scan from March 2020.  We discussed differential which could include other lower abdominal pathology.  Genitourinary issue seems less likely but has not been ruled out.  Karandeep was seen today for diverticulitis.  Diagnoses and all orders for this visit:  Diverticulitis of large intestine without bleeding, unspecified complication status -     Basic Metabolic Panel; Future -  CBC with Differential; Future  LLQ abdominal pain -     Basic Metabolic Panel; Future -     CBC with Differential; Future  Chills -     Basic Metabolic Panel; Future -     CBC with Differential; Future  Left lower quadrant abdominal pain -     Basic Metabolic Panel; Future -     CBC with Differential; Future -     CT Abdomen Pelvis W Contrast; Future  Other orders -     HYDROcodone-acetaminophen (NORCO/VICODIN) 5-325 MG tablet; Take 1 tablet by mouth every 6 (six) hours as needed for moderate pain. -     metroNIDAZOLE (FLAGYL) 500 MG tablet;  Take 1 tablet (500 mg total) by mouth 3 (three) times daily. -     ciprofloxacin (CIPRO) 500 MG tablet; Take 1 tablet (500 mg total) by mouth 2 (two) times daily.

## 2019-03-10 NOTE — Telephone Encounter (Signed)
Pt is coming for a lab appointment tomorrow.

## 2019-03-10 NOTE — Telephone Encounter (Signed)
Call him back and see if we can get him in for labs tomorrow and possible CT abdomen pelvis tomorrow STAT.   I did get word back from gastro and he would like [place] to proceed with those test in addition to consult back with him.

## 2019-03-11 ENCOUNTER — Other Ambulatory Visit: Payer: Commercial Managed Care - PPO

## 2019-03-11 DIAGNOSIS — R6883 Chills (without fever): Secondary | ICD-10-CM

## 2019-03-11 DIAGNOSIS — K5732 Diverticulitis of large intestine without perforation or abscess without bleeding: Secondary | ICD-10-CM

## 2019-03-11 DIAGNOSIS — R1032 Left lower quadrant pain: Secondary | ICD-10-CM

## 2019-03-11 NOTE — Telephone Encounter (Signed)
Patient has an appointment scheduled for 03/19/19 @ 2:20.

## 2019-03-12 LAB — CBC WITH DIFFERENTIAL/PLATELET
Basophils Absolute: 0.1 10*3/uL (ref 0.0–0.2)
Basos: 1 %
EOS (ABSOLUTE): 0.2 10*3/uL (ref 0.0–0.4)
Eos: 2 %
Hematocrit: 46.6 % (ref 37.5–51.0)
Hemoglobin: 16 g/dL (ref 13.0–17.7)
Immature Grans (Abs): 0.1 10*3/uL (ref 0.0–0.1)
Immature Granulocytes: 1 %
Lymphocytes Absolute: 1.7 10*3/uL (ref 0.7–3.1)
Lymphs: 15 %
MCH: 28.9 pg (ref 26.6–33.0)
MCHC: 34.3 g/dL (ref 31.5–35.7)
MCV: 84 fL (ref 79–97)
Monocytes Absolute: 1.1 10*3/uL — ABNORMAL HIGH (ref 0.1–0.9)
Monocytes: 9 %
Neutrophils Absolute: 8.4 10*3/uL — ABNORMAL HIGH (ref 1.4–7.0)
Neutrophils: 72 %
Platelets: 303 10*3/uL (ref 150–450)
RBC: 5.53 x10E6/uL (ref 4.14–5.80)
RDW: 12.9 % (ref 11.6–15.4)
WBC: 11.4 10*3/uL — ABNORMAL HIGH (ref 3.4–10.8)

## 2019-03-12 LAB — BASIC METABOLIC PANEL
BUN/Creatinine Ratio: 12 (ref 9–20)
BUN: 14 mg/dL (ref 6–20)
CO2: 26 mmol/L (ref 20–29)
Calcium: 9.7 mg/dL (ref 8.7–10.2)
Chloride: 99 mmol/L (ref 96–106)
Creatinine, Ser: 1.15 mg/dL (ref 0.76–1.27)
GFR calc Af Amer: 95 mL/min/{1.73_m2} (ref >59)
GFR calc non Af Amer: 83 mL/min/{1.73_m2} (ref >59)
Glucose: 79 mg/dL (ref 65–99)
Potassium: 4.8 mmol/L (ref 3.5–5.2)
Sodium: 139 mmol/L (ref 134–144)

## 2019-03-19 ENCOUNTER — Other Ambulatory Visit: Payer: Self-pay

## 2019-03-19 ENCOUNTER — Encounter: Payer: Self-pay | Admitting: Gastroenterology

## 2019-03-19 ENCOUNTER — Ambulatory Visit: Payer: Commercial Managed Care - PPO | Admitting: Gastroenterology

## 2019-03-19 VITALS — BP 116/72 | HR 95 | Temp 98.3°F | Ht 73.0 in | Wt 264.2 lb

## 2019-03-19 DIAGNOSIS — K5730 Diverticulosis of large intestine without perforation or abscess without bleeding: Secondary | ICD-10-CM | POA: Diagnosis not present

## 2019-03-19 DIAGNOSIS — R1032 Left lower quadrant pain: Secondary | ICD-10-CM

## 2019-03-19 DIAGNOSIS — K5792 Diverticulitis of intestine, part unspecified, without perforation or abscess without bleeding: Secondary | ICD-10-CM

## 2019-03-19 MED ORDER — MESALAMINE ER 0.375 G PO CP24: ORAL_CAPSULE | ORAL | 3 refills | Status: DC

## 2019-03-19 NOTE — Patient Instructions (Signed)
We have sent the following medications to your pharmacy for you to pick up at your convenience: Apriso 1.5 gm daily  Return in 6 months for office visit  It was a pleasure to see you today!  Vito Cirigliano, D.O.

## 2019-03-19 NOTE — Progress Notes (Signed)
P  Chief Complaint:    Diverticulitis, LLQ pain  GI History: 34 year old male with recurrent diverticulitis.  Has had at least 4 episodes of diverticulitis since 2014, with index episode of diverticulitis diagnosed on CT while in [place] (was working there) in 2014.  Most recent episode was earlier this month.  Was seen by his PCM with return of LLQ pain.  WBC 11.4, otherwise normal CBC and BMP and was started on Cipro/Flagyl.    Endoscopic history: -Colonoscopy (04/2018, Dr. Bryan Lemma): Sigmoid/descending diverticulosis, localized area of erythema 30-40 cm from anal verge (suspected site of recent diverticulitis).  Otherwise normal.  Normal TI.  HPI:    Patient is a 34 y.o. male presenting to the [facility]astroenterology Clinic for follow-up and evaluation of recurrent diverticulitis as above.  Was initially seen by me in 04/2018 following another episode of diverticulitis in 02/2018.  Colonoscopy completed in 04/2018 as outlined above.  He had another episode earlier this month as outlined above.  Slight leukocytosis (11.4).  Ordered CT, but given resolution of pain, this was postponed.  Currenlty taking Cipro/Flagyl as prescribed.   States he has had having recurring LLQ pain over the last 6+ months, with 3 courses of antibiotics (I can see Augmentin in 11/2018-Cipro Flagyl in EMR).  Some instances only last 1-2 days, so he does not call in for ABX for each episode.  Today, he is without any pain.  Tolerating all p.o. intake.  Bowel habits normal.  No fever, chills.   Review of systems:     No chest pain, no SOB, no fevers, no urinary sx   Past Medical History:  Diagnosis Date  . Allergy    RHINITIS  . Diverticulitis 03/2012   few occurrences as of 05/2015  . Farsightedness    wears contacts    Patient's surgical history, family medical history, social history, medications and allergies were all reviewed in Epic    Current Outpatient Medications  Medication Sig Dispense Refill  .  Multiple Vitamin (MULTIVITAMIN) tablet Take 1 tablet by mouth daily.    . Multiple Vitamins-Minerals (IMMUNE SYSTEM BOOSTER PO) Take 3 tablets by mouth daily.     No current facility-administered medications for this visit.    Physical Exam:     BP 116/72   Pulse 95   Temp 98.3 F (36.8 C)   Ht 6\' 1"  (1.854 m)   Wt 264 lb 4 oz (119.9 kg)   BMI 34.86 kg/m   GENERAL:  Pleasant male in NAD PSYCH: : Cooperative, normal affect NEURO: Alert and oriented x 3, no focal neurologic deficits   IMPRESSION and PLAN:    1) Diverticulitis 2) History of diverticulosis 3) LLQ pain 34 year old male with recurrent episodes of diverticulitis, typically responsive to antimicrobial therapy.  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  Plan to evaluate and treat as below:  -Start trial of Apriso 1.5 g/day x6 months.  If resolution of symptoms and no recurrent episodes, can continue 5-ASA therapy for 12 months, then attempt to wean off -If recurrent episodes despite 5-ASA therapy, then this is a bit more indicative of true recurrent diverticulitis, that may require surgical intervention -Did discuss surgical intervention for segmental colitis and/or recurrent diverticulitis, to include details regarding surgery, risks and benefits of the surgery.  He would like to proceed with medical management instead of surgical referral -If recurrent episode, CT during acute pain -RTC in 6 months or sooner as  needed     I spent 31 minutes of time, including in depth chart review, independent review of results as outlined above, communicating results with the patient directly, face-to-face time with the patient, coordinating care, and ordering studies and medications as appropriate, and documentation.     Shellia Cleverly ,DO, FACG 03/19/2019, 2:35 PM

## 2019-10-29 ENCOUNTER — Other Ambulatory Visit: Payer: Self-pay

## 2019-10-29 ENCOUNTER — Telehealth: Payer: Commercial Managed Care - PPO | Admitting: Medical

## 2019-10-29 ENCOUNTER — Encounter: Payer: Self-pay | Admitting: Medical

## 2019-10-29 VITALS — Temp 98.0°F | Wt 250.0 lb

## 2019-10-29 DIAGNOSIS — K5792 Diverticulitis of intestine, part unspecified, without perforation or abscess without bleeding: Secondary | ICD-10-CM | POA: Diagnosis not present

## 2019-10-29 MED ORDER — AMOXICILLIN-POT CLAVULANATE 875-125 MG PO TABS: 1.0000 | ORAL_TABLET | Freq: Two times a day (BID) | ORAL | 0 refills | Status: DC

## 2019-10-29 MED ORDER — HYDROCODONE-ACETAMINOPHEN 5-325 MG PO TABS: 1.0000 | ORAL_TABLET | Freq: Four times a day (QID) | ORAL | 0 refills | Status: DC | PRN

## 2019-10-29 NOTE — Progress Notes (Signed)
This visit type was conducted due to national recommendations for restrictions regarding the COVID-19 Pandemic (e.g. social distancing) in an effort to limit this patient's exposure and mitigate transmission in our community.  Due to their co-morbid illnesses, this patient is at least at moderate risk for complications without adequate follow up.  This format is felt to be most appropriate for this patient at this time.    Documentation for virtual audio and video telecommunications through Zoom encounter:  The patient was located at home. The provider was located in the office. The patient did consent to this visit and is aware of possible charges through their insurance for this visit.  The other persons participating in this telemedicine service were none. Time spent on call was 20 minutes and in review of previous records >20 minutes total.  This virtual service is not related to other E/M service within previous 7 days.  Subjective: Chief Complaint  Patient presents with  . diverticulitis    diverticulitis- woke up with yesterday this. had this before, not getting better   Virtual consult today for diverticulitis flare.  He notes in the last few days having left lower quadrant pain, not feeling well, nausea.  No blood in stool no fever.  He has had several flareups in the last year or 2.  He did see gastroenterology back in January for the same.  At that time they started him on a medication Apriso which he took for 6 months.  This really seemed to help.  Since then he has had some minor flareups, but this flareup seems worse. no other aggravating or relieving factors. No other complaint.   Past Medical History:  Diagnosis Date  . Allergy    RHINITIS  . Diverticulitis 03/2012   few occurrences as of 05/2015  . Farsightedness    wears contacts   Current Outpatient Medications on File Prior to Visit  Medication Sig Dispense Refill  . Multiple Vitamin (MULTIVITAMIN) tablet Take 1  tablet by mouth daily.     No current facility-administered medications on file prior to visit.   ROS as in subjective   Objective: Temp 98 F (36.7 C)   Wt 250 lb (113.4 kg)   BMI 32.98 kg/m   Not examined in person as this was a virtual consult    Assessment: Encounter Diagnosis  Name Primary?  . Diverticulitis Yes     Plan: We discussed concerns and symptoms.  Begin antibiotic below, pain medicine as needed.   I reviewed January 2021 gastroenterology notes.  He completed 6 months of Apriso with good improvement.  However he did not follow-up with gastroenterology or did not do any other treatment.  He has had a few minor flareups between then and now.  Medication below.  Follow-up with gastroenterology.  Gastroenterology notes discussed possibly starting 5-ASA therapy which he never started  Dean Carter was seen today for diverticulitis.  Diagnoses and all orders for this visit:  Diverticulitis  Other orders -     amoxicillin-clavulanate (AUGMENTIN) 875-125 MG tablet; Take 1 tablet by mouth 2 (two) times daily. -     HYDROcodone-acetaminophen (NORCO/VICODIN) 5-325 MG tablet; Take 1 tablet by mouth every 6 (six) hours as needed for moderate pain.  f/u prn

## 2019-10-30 ENCOUNTER — Other Ambulatory Visit: Payer: Self-pay | Admitting: Medical

## 2019-10-30 ENCOUNTER — Telehealth: Payer: Self-pay | Admitting: Medical

## 2019-10-30 DIAGNOSIS — K5792 Diverticulitis of intestine, part unspecified, without perforation or abscess without bleeding: Secondary | ICD-10-CM

## 2019-10-30 DIAGNOSIS — R1032 Left lower quadrant pain: Secondary | ICD-10-CM

## 2019-10-30 NOTE — Telephone Encounter (Signed)
I conversed with Dr. Barron Alvine.  He recommends the patient have some labs as well.  so if he has the ability to come in for nurse visit, lets have him come in for labs.  I will put these in the system if he is going to come in.  The gastro office will be contacting him for follow-up as well

## 2019-10-30 NOTE — Progress Notes (Signed)
Sed ra

## 2019-10-31 NOTE — Telephone Encounter (Signed)
Awaiting patient to let me know what day and time will be best for him to come in for labs

## 2019-12-10 ENCOUNTER — Ambulatory Visit: Payer: Commercial Managed Care - PPO | Admitting: Gastroenterology

## 2019-12-10 ENCOUNTER — Encounter: Payer: Self-pay | Admitting: Gastroenterology

## 2019-12-10 VITALS — BP 124/80 | HR 88 | Ht 73.0 in | Wt 262.2 lb

## 2019-12-10 DIAGNOSIS — K5792 Diverticulitis of intestine, part unspecified, without perforation or abscess without bleeding: Secondary | ICD-10-CM

## 2019-12-10 NOTE — Progress Notes (Signed)
  P  Chief Complaint:    Diverticulitis follow-up  GI History: 34-year-old male with recurrent diverticulitis.  Has had at least 4 episodes of diverticulitis since 2014, with index episode of diverticulitis diagnosed on CT while in [place] (was working there) in 2014.  Most recent episode was 10/2019, treated with course of Augmentin by PCM with resolution of symptoms.  Labs ordered but not completed by patient.  Prior to that, last episode was 03/2019 (WBC 11.4; CT ordered but not done due to resolution of pain), treated with Cipro/Flagyl with resolution.  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  He had been doing well until 10/2019.   Endoscopic history: -Colonoscopy (04/2018, Dr. ): Sigmoid/descending diverticulosis, localized area of erythema 30-40 cm from anal verge (suspected site of recent diverticulitis).  Otherwise normal.  Normal TI.  HPI:     Patient is a 34 y.o. male presenting to the Gastroenterology Clinic for follow-up.  Was last seen by me in 03/2019 for follow-up above acute recurrent diverticulitis.  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   Was otherwise doing well until recurrence of acute diverticulitis in 10/2019.  Labs ordered by PCM, but never completed by patient.  Resolution of symptoms with course of Augmentin.  Today, states no inciting events or changes in diet, etc prior to recent episode.  Had his typical LLQ pain along with mucus-like stools.  Responded well to trial of Augmentin, and otherwise feels well today.   Review of systems:     No chest pain, no SOB, no fevers, no urinary sx   Past Medical History:  Diagnosis Date  . Allergy    RHINITIS  . Diverticulitis 03/2012   few occurrences as of 05/2015  .  Farsightedness    wears contacts    Patient's surgical history, family medical history, social history, medications and allergies were all reviewed in Epic    Current Outpatient Medications  Medication Sig Dispense Refill  . FIBER ADULT GUMMIES PO Take by mouth as needed.    . Multiple Vitamin (MULTIVITAMIN) tablet Take 1 tablet by mouth daily.     No current facility-administered medications for this visit.    Physical Exam:     BP 124/80   Pulse 88   Ht 6' 1" (1.854 m)   Wt 262 lb 4 oz (119 kg)   BMI 34.60 kg/m   GENERAL:  Pleasant male in NAD PSYCH: : Cooperative, normal affect EENT:  conjunctiva pink, mucous membranes moist, neck supple without masses ABDOMEN:  Nondistended, soft, nontender. No obvious masses, no hepatomegaly,  normal bowel sounds SKIN:  turgor, no lesions seen Musculoskeletal:  Normal muscle tone, normal strength NEURO: Alert and oriented x 3, no focal neurologic deficits   IMPRESSION and PLAN:    1) Acute recurrent diverticulitis Discussed acute recurrent diverticulitis at length today and plan for the following: -Referral to Colorectal Surgery clinic for evaluation of segmental resection -If recurrence of index symptoms, asked patient to call our clinic directly for CBC, BMP, ESR, CRP along with CT abdomen/pelvis and repeat course of antibiotics  I spent 25 minutes of time, including independent review of results as outlined above, communicating results with the patient directly, face-to-face time with the patient, coordinating care, ordering studies and medications as   appropriate, and documentation.            Lavena Bullion ,DO, FACG 12/10/2019, 10:52 AM

## 2019-12-10 NOTE — Patient Instructions (Signed)
If you are age 35 or older, your body mass index should be between 23-30. Your Body mass index is 34.6 kg/m. If this is out of the aforementioned range listed, please consider follow up with your Primary Care Provider.  If you are age 41 or younger, your body mass index should be between 19-25. Your Body mass index is 34.6 kg/m. If this is out of the aformentioned range listed, please consider follow up with your Primary Care Provider.   You are being referred to Dr. Cliffton Asters at [facility] for colorectal surgery.  His office will contact you with an appointment.  It was a pleasure to see you today!  Vito Cirigliano, D.O.

## 2019-12-15 ENCOUNTER — Other Ambulatory Visit: Payer: Self-pay | Admitting: Gastroenterology

## 2019-12-15 ENCOUNTER — Telehealth: Payer: Self-pay | Admitting: Gastroenterology

## 2019-12-15 DIAGNOSIS — K5792 Diverticulitis of intestine, part unspecified, without perforation or abscess without bleeding: Secondary | ICD-10-CM

## 2019-12-15 DIAGNOSIS — R1032 Left lower quadrant pain: Secondary | ICD-10-CM

## 2019-12-15 MED ORDER — METRONIDAZOLE 500 MG PO TABS: 500.0000 mg | ORAL_TABLET | Freq: Two times a day (BID) | ORAL | 0 refills | Status: DC

## 2019-12-15 MED ORDER — CIPROFLOXACIN HCL 500 MG PO TABS: 500.0000 mg | ORAL_TABLET | Freq: Two times a day (BID) | ORAL | 0 refills | Status: AC

## 2019-12-15 NOTE — Telephone Encounter (Signed)
Patient called to follow up on status and said he has diverticulitis flare up also stated he spoke with Dr. Barron Alvine and he recommended he call to get a CT scheduled along with medication

## 2019-12-15 NOTE — Telephone Encounter (Signed)
Thank you for taking care of him. That plan is perfect as outlined. No changes. Will f/u on results. He also has a referral in place to see the surgeon.

## 2019-12-15 NOTE — Telephone Encounter (Signed)
Spoke to patient this morning who reports symptoms of diverticulitis starting 12/14/19. He has LLQ abdominal pain. No fever,N/V noted. Patient will come by the office today for CBC, BMP, ESR, CRP, A CT of abdomen/and pelvis is scheduled for 12/17/19. Due to insurance he has to wait a few days for the CT. A repeat course of Cipro 500 mg BID and Flagyl 500 BID for 10days sent to his pharmacy. Dr Fransico Him please advise on further treatment.

## 2019-12-16 LAB — CBC WITH DIFFERENTIAL/PLATELET
Basophils Absolute: 0.1 10*3/uL (ref 0.0–0.2)
Basos: 1 %
EOS (ABSOLUTE): 0.1 10*3/uL (ref 0.0–0.4)
Eos: 1 %
Hematocrit: 47.1 % (ref 37.5–51.0)
Hemoglobin: 16.1 g/dL (ref 13.0–17.7)
Immature Grans (Abs): 0.1 10*3/uL (ref 0.0–0.1)
Immature Granulocytes: 1 %
Lymphocytes Absolute: 1.5 10*3/uL (ref 0.7–3.1)
Lymphs: 12 %
MCH: 29 pg (ref 26.6–33.0)
MCHC: 34.2 g/dL (ref 31.5–35.7)
MCV: 85 fL (ref 79–97)
Monocytes Absolute: 1.3 10*3/uL — ABNORMAL HIGH (ref 0.1–0.9)
Monocytes: 10 %
Neutrophils Absolute: 9.7 10*3/uL — ABNORMAL HIGH (ref 1.4–7.0)
Neutrophils: 75 %
Platelets: 288 10*3/uL (ref 150–450)
RBC: 5.55 x10E6/uL (ref 4.14–5.80)
RDW: 12.8 % (ref 11.6–15.4)
WBC: 12.8 10*3/uL — ABNORMAL HIGH (ref 3.4–10.8)

## 2019-12-16 LAB — BASIC METABOLIC PANEL
BUN/Creatinine Ratio: 14 (ref 9–20)
BUN: 16 mg/dL (ref 6–20)
CO2: 25 mmol/L (ref 20–29)
Calcium: 9.8 mg/dL (ref 8.7–10.2)
Chloride: 102 mmol/L (ref 96–106)
Creatinine, Ser: 1.12 mg/dL (ref 0.76–1.27)
GFR calc Af Amer: 99 mL/min/{1.73_m2} (ref >59)
GFR calc non Af Amer: 85 mL/min/{1.73_m2} (ref >59)
Glucose: 93 mg/dL (ref 65–99)
Potassium: 4.6 mmol/L (ref 3.5–5.2)
Sodium: 141 mmol/L (ref 134–144)

## 2019-12-16 LAB — SEDIMENTATION RATE: Sed Rate: 5 mm/hr (ref 0–15)

## 2019-12-16 LAB — HIGH SENSITIVITY CRP: CRP, High Sensitivity: 18.39 mg/L — ABNORMAL HIGH (ref 0.00–3.00)

## 2019-12-17 ENCOUNTER — Encounter (HOSPITAL_BASED_OUTPATIENT_CLINIC_OR_DEPARTMENT_OTHER): Payer: Self-pay

## 2019-12-17 ENCOUNTER — Other Ambulatory Visit: Payer: Self-pay

## 2019-12-17 ENCOUNTER — Ambulatory Visit (HOSPITAL_BASED_OUTPATIENT_CLINIC_OR_DEPARTMENT_OTHER)
Admission: RE | Admit: 2019-12-17 | Discharge: 2019-12-17 | Disposition: A | Discharge status: Home / Self Care | Payer: Commercial Managed Care - PPO | Source: Ambulatory Visit | Attending: Gastroenterology | Admitting: Gastroenterology

## 2019-12-17 DIAGNOSIS — R1032 Left lower quadrant pain: Secondary | ICD-10-CM | POA: Insufficient documentation

## 2019-12-17 DIAGNOSIS — K5792 Diverticulitis of intestine, part unspecified, without perforation or abscess without bleeding: Secondary | ICD-10-CM

## 2019-12-17 MED ORDER — IOHEXOL 300 MG/ML  SOLN
100.0000 mL | Freq: Once | INTRAMUSCULAR | Status: AC | PRN
  Administered 2019-12-17: 100 mL via INTRAVENOUS

## 2020-03-06 HISTORY — PX: LASIK: SHX215

## 2020-05-17 ENCOUNTER — Telehealth: Payer: Self-pay | Admitting: Gastroenterology

## 2020-05-17 DIAGNOSIS — K5792 Diverticulitis of intestine, part unspecified, without perforation or abscess without bleeding: Secondary | ICD-10-CM

## 2020-05-17 DIAGNOSIS — R1032 Left lower quadrant pain: Secondary | ICD-10-CM

## 2020-05-17 NOTE — Telephone Encounter (Signed)
Patient called states he thinks he is having a diverticulitis flare up.

## 2020-05-17 NOTE — Telephone Encounter (Signed)
Spoke with patient, he reports that over the weekend he began having LLQ pain, denies fever, nausea, or vomiting. Patient thinks he may be beginning to have another diverticulitis flare. Please advise, thank you.

## 2020-05-18 MED ORDER — CIPROFLOXACIN HCL 500 MG PO TABS: 500.0000 mg | ORAL_TABLET | Freq: Two times a day (BID) | ORAL | 0 refills | Status: AC

## 2020-05-18 MED ORDER — METRONIDAZOLE 500 MG PO TABS: 500.0000 mg | ORAL_TABLET | Freq: Two times a day (BID) | ORAL | 0 refills | Status: DC

## 2020-05-18 NOTE — Telephone Encounter (Signed)
Spoke with patient in regards to Dr. Frankey Shown recommendations. Patient will come to the [place] office for lab work at his convenience between 7:30 AM - 5 PM. Patient updated pharmacy, prescriptions have been sent to pharmacy on file. Patient has been scheduled for a follow up on Tuesday, 05/25/20 at 10:40 AM. Patient verbalized understanding of all information. Patient states that he saw Dr. Cliffton Asters a couple of months ago for consultation, he is scheduled for his surgery on Wednesday, 06/23/20 but will be having some upcoming appts for pre-op. Patient had no concerns at the end of the call.

## 2020-05-18 NOTE — Telephone Encounter (Signed)
Certainly sounds consistent with acute recurrent diverticulitis again.  Please send to lab for CBC, ESR, CRP and send in Rx for course of Cipro 500 mg BID and Flagyl 500 BID for 10days.  Also check to see if he has been seen by Colorectal Surgery.  We previously discussed the possibility of segmental resection if he has recurrent episodes of diverticulitis.  His last episode in 12/2019 had clear diverticulitis on CT along with mild leukocytosis and elevated CRP.  Do not feel strongly that a repeat CT is needed in the absence of escalating symptoms.  Please schedule follow-up appoint with me in the next week or so to see how he is doing (okay to overbook).  Thank you.

## 2020-05-24 NOTE — Telephone Encounter (Signed)
Lm on vm for patient to return call 

## 2020-05-24 NOTE — Telephone Encounter (Signed)
Pt called to inform that he will not be able to come to do lab work today but he will come tomorrow so he needs to r/s his f/u ov with Dr. Barron Alvine that was scheduled for tomorrow at 10:40am. Pls call him.

## 2020-05-25 ENCOUNTER — Other Ambulatory Visit (HOSPITAL_COMMUNITY)
Admission: RE | Admit: 2020-05-25 | Discharge: 2020-05-25 | Disposition: A | Discharge status: Home / Self Care | Payer: Commercial Managed Care - PPO | Source: Ambulatory Visit | Attending: Gastroenterology | Admitting: Gastroenterology

## 2020-05-25 ENCOUNTER — Other Ambulatory Visit: Payer: Self-pay

## 2020-05-25 DIAGNOSIS — R1032 Left lower quadrant pain: Secondary | ICD-10-CM | POA: Insufficient documentation

## 2020-05-25 DIAGNOSIS — K5792 Diverticulitis of intestine, part unspecified, without perforation or abscess without bleeding: Secondary | ICD-10-CM | POA: Insufficient documentation

## 2020-05-25 LAB — CBC WITH DIFFERENTIAL/PLATELET
Abs Immature Granulocytes: 0.03 10*3/uL (ref 0.00–0.07)
Basophils Absolute: 0.1 10*3/uL (ref 0.0–0.1)
Basophils Relative: 1 %
Eosinophils Absolute: 0.1 10*3/uL (ref 0.0–0.5)
Eosinophils Relative: 1 %
HCT: 45.7 % (ref 39.0–52.0)
Hemoglobin: 15 g/dL (ref 13.0–17.0)
Immature Granulocytes: 0 %
Lymphocytes Relative: 16 %
Lymphs Abs: 1.7 10*3/uL (ref 0.7–4.0)
MCH: 28.1 pg (ref 26.0–34.0)
MCHC: 32.8 g/dL (ref 30.0–36.0)
MCV: 85.6 fL (ref 80.0–100.0)
Monocytes Absolute: 0.8 10*3/uL (ref 0.1–1.0)
Monocytes Relative: 7 %
Neutro Abs: 8 10*3/uL — ABNORMAL HIGH (ref 1.7–7.7)
Neutrophils Relative %: 75 %
Platelets: 283 10*3/uL (ref 150–400)
RBC: 5.34 MIL/uL (ref 4.22–5.81)
RDW: 13.2 % (ref 11.5–15.5)
WBC: 10.8 10*3/uL — ABNORMAL HIGH (ref 4.0–10.5)
nRBC: 0 % (ref 0.0–0.2)

## 2020-05-25 LAB — C-REACTIVE PROTEIN: CRP: 1.2 mg/dL — ABNORMAL HIGH (ref <1.0)

## 2020-05-25 LAB — SEDIMENTATION RATE: Sed Rate: 0 mm/hr (ref 0–16)

## 2020-05-25 NOTE — Telephone Encounter (Signed)
Patient had lab work drawn today at [facility].

## 2020-05-26 NOTE — Telephone Encounter (Signed)
Left message to call back  

## 2020-05-27 LAB — HIGH SENSITIVITY CRP: CRP, High Sensitivity: 3.16 mg/L — ABNORMAL HIGH (ref 0.00–3.00)

## 2020-06-02 NOTE — Telephone Encounter (Signed)
Spoke with patient, see 05/25/20 result note for more information.

## 2020-06-04 HISTORY — PX: COLON SURGERY: SHX602

## 2020-06-07 NOTE — Progress Notes (Signed)
Sent message, via epic in basket, requesting orders in epic from surgeon.  

## 2020-06-08 ENCOUNTER — Ambulatory Visit: Payer: Self-pay | Admitting: Surgery

## 2020-06-08 NOTE — H&P (Signed)
CC: Recurrent diverticulitis  HPI: [name] is a very pleasant 35 year old referred to [place] by Dr. Barron Alvine for history of recurrent diverticulitis. He reports his symptoms began back in 2014 with sharp severe left lower quadrant pain that he reports having had a CT scan done in [place] for and was told he had diverticulitis. He reports that he's had at least 3-4 attacks of this can and pain per year over the last 4 days. He will often times taking a bionics we will gradually help the symptoms entirely resolved. He believes that his pain has been getting worse with his attacks more recently. He has been following with gastroenterology and underwent a colonoscopy last year (04/2018) which is notable for diverticulosis as well as some erythema/thickening in the sigmoid colon 30-40 cm from the anal verge. Biopsies were benign polypoid mucosa. His last attack of this kind of pain which lasted for 3 or 4 days was 12/2019. He underwent CT scan with Dr. Barron Alvine which demonstrated sigmoid diverticulitis complicated features. He was subsequently referred to see [place] for his multiple recurrent attacks. At this time, he denies any fever/chills/nausea/vomiting. He has minimal if any abdominal discomfort and left lower quadrant. He denies any history of blood in his stool and weight loss. He denies any family history of known cancers, ulcers or colitis, or Crohn's.  PMH: Diverticulitis  PSH: Open appendectomy 25 years ago as a child  FHx: Denies FHx of colorectal, breast, endometrial, ovarian or cervical cancer  Social: Denies use of tobacco/drugs. Vapes. Rare social EtOH use. Works as Emergency planning/management officer in [employer].  ROS: A comprehensive 10 system review of systems was completed with the patient and pertinent findings as noted above.  The patient is a 35 year old male.   Past Surgical History Micheal Likens, CMA; 01/06/2020 10:37 AM) Appendectomy   Diagnostic Studies History [facility] Lonni Fix,  CMA; 01/06/2020 10:37 AM) Colonoscopy  1-5 years ago  Allergies Hedy Camara Lonni Fix, CMA; 01/06/2020 10:37 AM) No Known Drug Allergies  [01/06/2020]: Allergies Reconciled   Medication History (Chanel Lonni Fix, CMA; 01/06/2020 10:37 AM) No Current Medications Medications Reconciled  Other Problems (Chanel Lonni Fix, CMA; 01/06/2020 10:37 AM) Diverticulosis     Review of Systems (Chanel Nolan CMA; 01/06/2020 10:37 AM) General Not Present- Appetite Loss, Chills, Fatigue, Fever, Night Sweats, Weight Gain and Weight Loss. HEENT Not Present- Earache, Hearing Loss, Hoarseness, Nose Bleed, Oral Ulcers, Ringing in the Ears, Seasonal Allergies, Sinus Pain, Sore Throat, Visual Disturbances, Wears glasses/contact lenses and Yellow Eyes. Cardiovascular Not Present- Chest Pain, Difficulty Breathing Lying Down, Leg Cramps, Palpitations, Rapid Heart Rate, Shortness of Breath and Swelling of Extremities. Gastrointestinal Not Present- Abdominal Pain, Bloating, Bloody Stool, Change in Bowel Habits, Chronic diarrhea, Constipation, Difficulty Swallowing, Excessive gas, Gets full quickly at meals, Hemorrhoids, Indigestion, Nausea, Rectal Pain and Vomiting. Endocrine Not Present- Cold Intolerance, Excessive Hunger, Hair Changes, Heat Intolerance, Hot flashes and New Diabetes. Hematology Not Present- Blood Thinners, Easy Bruising, Excessive bleeding, Gland problems, HIV and Persistent Infections.  Vitals (Chanel Nolan CMA; 01/06/2020 10:37 AM) 01/06/2020 10:37 AM Weight: 265.25 lb Height: 73in Body Surface Area: 2.43 m Body Mass Index: 35 kg/m  Temp.: 97.29F  Pulse: 68 (Regular)        Physical Exam Cristal Deer M. Brinlyn Cena MD; 01/06/2020 11:23 AM) The physical exam findings are as follows: Note: Constitutional: No acute distress; conversant; no deformities Eyes: Moist conjunctiva; no lid lag; anicteric sclerae; pupils equal and round Neck: Trachea midline; no palpable thyromegaly Lungs: Normal  respiratory effort; no tactile  fremitus CV: rrr; no palpable thrill; no pitting edema GI: Abdomen soft, nontender, nondistended; no palpable hepatosplenomegaly MSK: Normal gait; no clubbing/cyanosis Psychiatric: Appropriate affect; alert and oriented 3 Lymphatic: No palpable cervical or axillary lymphadenopathy    Assessment & Plan Cristal Deer M. Mairlyn Tegtmeyer MD; 01/06/2020 11:27 AM) DIVERTICULITIS (K57.92) Story: Dean Carter is a very pleasant 33yoM with hx of numerous prior bouts of 'uncomplicated' diverticulitis - totaling >20 over last 7 years Colonoscopy 04/2018 - no evidence of malignancy; benign polypoid sigmoid mucosa on biopsy - n clear evidence of IBD either   -The anatomy and physiology of the GI tract was discussed at length with the patient. The pathophysiology of diverticulitis was discussed at length with associated pictures. -We have reviewed options going forward. We discussed that with "an complicated inguinal attacks, surgery being considered for quality of life reasons. We discussed that there is no magic number of attacks. We discussed the potential benefits of surgery with regards to significant reduction in the risk of recurrence. He is interested in considering surgery at this point but has requested some additional time to think about everything. We did discuss with his young age and frequency/numerous attacks, the potential for long-term development of a sigmoid colon stricture and what this may entail. -We went ahead and reviewed robotic sigmoidectomy; flexible sigmoidoscopy; scenarios where an 'open' approach may be necessary -The planned procedures, material risks (including, but not limited to, pain, bleeding, infection, scarring, need for blood transfusion, damage to surrounding structures- blood vessels/nerves/viscus/organs, damage to ureter, urine leak, leak from anastomosis, need for additional procedures, worsening of pre-existing medical conditions, scenarios where a  stoma may be necessary and in uncommon cases can be permanent, hernia, recurrence, DVT/PE, pneumonia, heart attack, stroke, death) benefits and alternatives to surgery were discussed at length. I noted a good probability that the procedure would help improve his symptoms as noted above. The patient's questions were answered to his satisfaction, he voiced understanding and will let us know his decision. Additionally, we discussed typical postoperative expectations and the recovery process.

## 2020-06-11 NOTE — Patient Instructions (Addendum)
DUE TO COVID-19 ONLY ONE VISITOR IS ALLOWED TO COME WITH YOU AND STAY IN THE WAITING ROOM ONLY DURING PRE OP AND PROCEDURE DAY OF SURGERY. THE 1 VISITOR  MAY VISIT WITH YOU AFTER SURGERY IN YOUR PRIVATE ROOM DURING VISITING HOURS ONLY!  YOU NEED TO HAVE A COVID 19 TEST ON__4/18_____ @__1 :00 pm_____, THIS TEST MUST BE DONE BEFORE SURGERY,  COVID TESTING SITE [address] [place] , IT IS ON THE RIGHT GOING OUT [address] APPROXIMATELY  2 MINUTES PAST [employer] ON THE RIGHT. ONCE YOUR COVID TEST IS COMPLETED,  PLEASE BEGIN THE QUARANTINE INSTRUCTIONS AS OUTLINED IN YOUR HANDOUT.                [id number]   Your procedure is scheduled on: 06/23/20   Report to [facility] Main  Entrance   Report to admitting at  11:00 AM     Call this number if you have problems the morning of surgery [phone]    BRUSH YOUR TEETH MORNING OF SURGERY AND RINSE YOUR MOUTH OUT, NO CHEWING GUM CANDY OR MINTS.  Follow all instructions for bowel prep from Dr. 02-13-1998 office.   Drink plenty of fluids on day of prep to prevent dehydration.  DRINK 2 PRESURGERY ENSURE DRINKS THE NIGHT BEFORE SURGERY AT 10:00 PM.    NO SOLIDS AFTER MIDNIGHT THE DAY PRIOR TO THE SURGERY.   NOTHING BY MOUTH EXCEPT CLEAR LIQUIDS UNTIL  10:00 am    CLEAR LIQUID DIET   Foods Allowed                                                                     Foods Excluded  Coffee and tea, regular and decaf                             liquids that you cannot  Plain Jell-O any favor except red or purple                                           see through such as: Fruit ices (not with fruit pulp)                                     milk, soups, orange juice  Iced Popsicles                                    All solid food Carbonated beverages, regular and diet                                    Cranberry, grape and apple juices Sports drinks like Gatorade Lightly seasoned clear broth  or consume(fat free) Sugar, honey syrup        Take these medicines the morning of surgery with A SIP OF WATER: None  You may not have any metal on your body including              piercings  Do not wear jewelry,  lotions, powders or deodorant              Men may shave face and neck.   Do not bring valuables to the hospital. [facility] IS NOT             RESPONSIBLE   FOR VALUABLES.  Contacts, dentures or bridgework may not be worn into surgery.     Special Instructions: N/A              Please read over the following fact sheets you were given: _____________________________________________________________________             [facility] - Preparing for Surgery Before surgery, you can play an important role.  Because skin is not sterile, your skin needs to be as free of germs as possible.  You can reduce the number of germs on your skin by washing with CHG (chlorahexidine gluconate) soap before surgery.  CHG is an antiseptic cleaner which kills germs and bonds with the skin to continue killing germs even after washing. Please DO NOT use if you have an allergy to CHG or antibacterial soaps.  If your skin becomes reddened/irritated stop using the CHG and inform your nurse when you arrive at Short Stay.   You may shave your face/neck. Please follow these instructions carefully:  1.  Shower with CHG Soap the night before surgery and the  morning of Surgery.  2.  If you choose to wash your hair, wash your hair first as usual with your  normal  shampoo.  3.  After you shampoo, rinse your hair and body thoroughly to remove the  shampoo.                                        4.  Use CHG as you would any other liquid soap.  You can apply chg directly  to the skin and wash                       Gently with a scrungie or clean washcloth.  5.  Apply the CHG Soap to your body ONLY FROM THE NECK DOWN.   Do not use on face/ open                            Wound or open sores. Avoid contact with eyes, ears mouth and genitals (private parts).                       Wash face,  Genitals (private parts) with your normal soap.             6.  Wash thoroughly, paying special attention to the area where your surgery  will be performed.  7.  Thoroughly rinse your body with warm water from the neck down.  8.  DO NOT shower/wash with your normal soap after using and rinsing off  the CHG Soap.             9.  Pat yourself dry with a clean towel.            10.  Wear clean pajamas.  11.  Place clean sheets on your bed the night of your first shower and do not  sleep with pets. Day of Surgery : Do not apply any lotions/deodorants the morning of surgery.  Please wear clean clothes to the hospital/surgery center.  FAILURE TO FOLLOW THESE INSTRUCTIONS MAY RESULT IN THE CANCELLATION OF YOUR SURGERY PATIENT SIGNATURE_________________________________  NURSE SIGNATURE__________________________________  ________________________________________________________________________   Dean Carter  An incentive spirometer is a tool that can help keep your lungs clear and active. This tool measures how well you are filling your lungs with each breath. Taking long deep breaths may help reverse or decrease the chance of developing breathing (pulmonary) problems (especially infection) following:  A long period of time when you are unable to move or be active. BEFORE THE PROCEDURE   If the spirometer includes an indicator to show your best effort, your nurse or respiratory therapist will set it to a desired goal.  If possible, sit up straight or lean slightly forward. Try not to slouch.  Hold the incentive spirometer in an upright position. INSTRUCTIONS FOR USE  1. Sit on the edge of your bed if possible, or sit up as far as you can in bed or on a chair. 2. Hold the incentive spirometer in an upright position. 3. Breathe out normally. 4. Place the  mouthpiece in your mouth and seal your lips tightly around it. 5. Breathe in slowly and as deeply as possible, raising the piston or the ball toward the top of the column. 6. Hold your breath for 3-5 seconds or for as long as possible. Allow the piston or ball to fall to the bottom of the column. 7. Remove the mouthpiece from your mouth and breathe out normally. 8. Rest for a few seconds and repeat Steps 1 through 7 at least 10 times every 1-2 hours when you are awake. Take your time and take a few normal breaths between deep breaths. 9. The spirometer may include an indicator to show your best effort. Use the indicator as a goal to work toward during each repetition. 10. After each set of 10 deep breaths, practice coughing to be sure your lungs are clear. If you have an incision (the cut made at the time of surgery), support your incision when coughing by placing a pillow or rolled up towels firmly against it. Once you are able to get out of bed, walk around indoors and cough well. You may stop using the incentive spirometer when instructed by your caregiver.  RISKS AND COMPLICATIONS  Take your time so you do not get dizzy or light-headed.  If you are in pain, you may need to take or ask for pain medication before doing incentive spirometry. It is harder to take a deep breath if you are having pain. AFTER USE  Rest and breathe slowly and easily.  It can be helpful to keep track of a log of your progress. Your caregiver can provide you with a simple table to help with this. If you are using the spirometer at home, follow these instructions: SEEK MEDICAL CARE IF:   You are having difficultly using the spirometer.  You have trouble using the spirometer as often as instructed.  Your pain medication is not giving enough relief while using the spirometer.  You develop fever of 100.5 F (38.1 C) or higher. SEEK IMMEDIATE MEDICAL CARE IF:   You cough up bloody sputum that had not been present  before.  You develop fever of 102 F (38.9 C)  or greater.  You develop worsening pain at or near the incision site. MAKE SURE YOU:   Understand these instructions.  Will watch your condition.  Will get help right away if you are not doing well or get worse. Document Released: 07/03/2006 Document Revised: 05/15/2011 Document Reviewed: 09/03/2006 [facility] Patient Information 2014 [place], [place].   ________________________________________________________________________

## 2020-06-14 ENCOUNTER — Encounter (HOSPITAL_COMMUNITY): Payer: Self-pay

## 2020-06-14 ENCOUNTER — Other Ambulatory Visit: Payer: Self-pay

## 2020-06-14 ENCOUNTER — Encounter (HOSPITAL_COMMUNITY)
Admission: RE | Admit: 2020-06-14 | Discharge: 2020-06-14 | Disposition: A | Discharge status: Home / Self Care | Payer: Commercial Managed Care - PPO | Source: Ambulatory Visit | Attending: Surgery | Admitting: Surgery

## 2020-06-14 DIAGNOSIS — Z01812 Encounter for preprocedural laboratory examination: Secondary | ICD-10-CM | POA: Insufficient documentation

## 2020-06-14 LAB — CBC WITH DIFFERENTIAL/PLATELET
Abs Immature Granulocytes: 0.05 10*3/uL (ref 0.00–0.07)
Basophils Absolute: 0.1 10*3/uL (ref 0.0–0.1)
Basophils Relative: 1 %
Eosinophils Absolute: 0.1 10*3/uL (ref 0.0–0.5)
Eosinophils Relative: 2 %
HCT: 48.2 % (ref 39.0–52.0)
Hemoglobin: 15.9 g/dL (ref 13.0–17.0)
Immature Granulocytes: 1 %
Lymphocytes Relative: 20 %
Lymphs Abs: 1.5 10*3/uL (ref 0.7–4.0)
MCH: 28.1 pg (ref 26.0–34.0)
MCHC: 33 g/dL (ref 30.0–36.0)
MCV: 85.3 fL (ref 80.0–100.0)
Monocytes Absolute: 0.6 10*3/uL (ref 0.1–1.0)
Monocytes Relative: 9 %
Neutro Abs: 5.2 10*3/uL (ref 1.7–7.7)
Neutrophils Relative %: 67 %
Platelets: 286 10*3/uL (ref 150–400)
RBC: 5.65 MIL/uL (ref 4.22–5.81)
RDW: 13.6 % (ref 11.5–15.5)
WBC: 7.6 10*3/uL (ref 4.0–10.5)
nRBC: 0 % (ref 0.0–0.2)

## 2020-06-14 LAB — BASIC METABOLIC PANEL
Anion gap: 8 (ref 5–15)
BUN: 18 mg/dL (ref 6–20)
CO2: 24 mmol/L (ref 22–32)
Calcium: 8.9 mg/dL (ref 8.9–10.3)
Chloride: 104 mmol/L (ref 98–111)
Creatinine, Ser: 1.15 mg/dL (ref 0.61–1.24)
GFR, Estimated: >60 mL/min (ref >60)
Glucose, Bld: 92 mg/dL (ref 70–99)
Potassium: 4.2 mmol/L (ref 3.5–5.1)
Sodium: 136 mmol/L (ref 135–145)

## 2020-06-14 LAB — HEMOGLOBIN A1C
Hgb A1c MFr Bld: 6 % — ABNORMAL HIGH (ref 4.8–5.6)
Mean Plasma Glucose: 125.5 mg/dL

## 2020-06-14 NOTE — Progress Notes (Signed)
COVID Vaccine Completed:Yes Date COVID Vaccine completed:06/19/19-booster 04/2020 COVID vaccine manufacturer:   Moderna     PCP - Crosby Oyster PA Cardiologist - none  Chest x-ray - no EKG - no Stress Test - no ECHO - no Cardiac Cath - no Pacemaker/ICD device last checked:NA  Sleep Study - no CPAP -   Fasting Blood Sugar - NA Checks Blood Sugar _____ times a day  Blood Thinner Instructions:no Aspirin Instructions: Last Dose:  Anesthesia review:   Patient denies shortness of breath, fever, cough and chest pain at PAT appointment yes Patient verbalized understanding of instructions that were given to them at the PAT appointment. Patient was also instructed that they will need to review over the PAT instructions again at home before surgery.Yes Pt has no SOB with any activities.

## 2020-06-21 ENCOUNTER — Other Ambulatory Visit (HOSPITAL_COMMUNITY)
Admission: RE | Admit: 2020-06-21 | Discharge: 2020-06-21 | Disposition: A | Discharge status: Home / Self Care | Payer: Commercial Managed Care - PPO | Source: Ambulatory Visit | Attending: Surgery | Admitting: Surgery

## 2020-06-21 DIAGNOSIS — Z01812 Encounter for preprocedural laboratory examination: Secondary | ICD-10-CM | POA: Insufficient documentation

## 2020-06-21 DIAGNOSIS — Z20822 Contact with and (suspected) exposure to covid-19: Secondary | ICD-10-CM | POA: Insufficient documentation

## 2020-06-22 LAB — SARS CORONAVIRUS 2 (TAT 6-24 HRS): SARS Coronavirus 2: NEGATIVE

## 2020-06-23 ENCOUNTER — Ambulatory Visit (HOSPITAL_COMMUNITY): Payer: Commercial Managed Care - PPO | Admitting: Anesthesiology

## 2020-06-23 ENCOUNTER — Encounter (HOSPITAL_COMMUNITY): Payer: Self-pay | Admitting: Surgery

## 2020-06-23 ENCOUNTER — Inpatient Hospital Stay (HOSPITAL_COMMUNITY)
Admission: RE | Admit: 2020-06-23 | Discharge: 2020-06-26 | DRG: 331 | Disposition: A | Discharge status: Home / Self Care | Payer: Commercial Managed Care - PPO | Attending: Surgery | Admitting: Surgery

## 2020-06-23 ENCOUNTER — Encounter (HOSPITAL_COMMUNITY)
Admission: RE | Disposition: A | Discharge status: Home / Self Care | Payer: Self-pay | Source: Ambulatory Visit | Attending: Surgery

## 2020-06-23 DIAGNOSIS — K5720 Diverticulitis of large intestine with perforation and abscess without bleeding: Secondary | ICD-10-CM | POA: Diagnosis present

## 2020-06-23 DIAGNOSIS — Z20822 Contact with and (suspected) exposure to covid-19: Secondary | ICD-10-CM | POA: Diagnosis present

## 2020-06-23 DIAGNOSIS — Z8249 Family history of ischemic heart disease and other diseases of the circulatory system: Secondary | ICD-10-CM

## 2020-06-23 DIAGNOSIS — Z8261 Family history of arthritis: Secondary | ICD-10-CM | POA: Diagnosis not present

## 2020-06-23 DIAGNOSIS — Z823 Family history of stroke: Secondary | ICD-10-CM | POA: Diagnosis not present

## 2020-06-23 DIAGNOSIS — B359 Dermatophytosis, unspecified: Secondary | ICD-10-CM | POA: Diagnosis present

## 2020-06-23 DIAGNOSIS — Z833 Family history of diabetes mellitus: Secondary | ICD-10-CM | POA: Diagnosis not present

## 2020-06-23 DIAGNOSIS — Z9049 Acquired absence of other specified parts of digestive tract: Secondary | ICD-10-CM

## 2020-06-23 HISTORY — PX: FLEXIBLE SIGMOIDOSCOPY: SHX5431

## 2020-06-23 LAB — TYPE AND SCREEN
ABO/RH(D): O POS
Antibody Screen: NEGATIVE

## 2020-06-23 LAB — ABO/RH: ABO/RH(D): O POS

## 2020-06-23 SURGERY — COLECTOMY, SIGMOID, ROBOT-ASSISTED
Anesthesia: General

## 2020-06-23 MED ORDER — MIDAZOLAM HCL 2 MG/2ML IJ SOLN
INTRAMUSCULAR | Status: AC
  Filled 2020-06-23: qty 2

## 2020-06-23 MED ORDER — SODIUM CHLORIDE 0.9 % IV SOLN
INTRAVENOUS | Status: AC
  Filled 2020-06-23: qty 2

## 2020-06-23 MED ORDER — HYDRALAZINE HCL 20 MG/ML IJ SOLN
INTRAMUSCULAR | Status: DC | PRN
  Administered 2020-06-23: 2 mg via INTRAVENOUS

## 2020-06-23 MED ORDER — HYDROMORPHONE HCL 1 MG/ML IJ SOLN: 0.2500 mg | INTRAMUSCULAR | Status: DC | PRN

## 2020-06-23 MED ORDER — ALVIMOPAN 12 MG PO CAPS
12.0000 mg | ORAL_CAPSULE | Freq: Two times a day (BID) | ORAL | Status: DC
  Administered 2020-06-24 – 2020-06-25 (×3): 12 mg via ORAL
  Filled 2020-06-23 (×4): qty 1

## 2020-06-23 MED ORDER — PROPOFOL 10 MG/ML IV BOLUS
INTRAVENOUS | Status: DC | PRN
  Administered 2020-06-23: 200 mg via INTRAVENOUS

## 2020-06-23 MED ORDER — LIDOCAINE HCL (CARDIAC) PF 100 MG/5ML IV SOSY
PREFILLED_SYRINGE | INTRAVENOUS | Status: DC | PRN
  Administered 2020-06-23: 100 mg via INTRAVENOUS

## 2020-06-23 MED ORDER — DIPHENHYDRAMINE HCL 50 MG/ML IJ SOLN: 12.5000 mg | Freq: Four times a day (QID) | INTRAMUSCULAR | Status: DC | PRN

## 2020-06-23 MED ORDER — LIDOCAINE HCL (PF) 2 % IJ SOLN
INTRAMUSCULAR | Status: DC | PRN
  Administered 2020-06-23: 1.5 mg/kg/h via INTRADERMAL

## 2020-06-23 MED ORDER — ROCURONIUM BROMIDE 10 MG/ML (PF) SYRINGE
PREFILLED_SYRINGE | INTRAVENOUS | Status: AC
  Filled 2020-06-23: qty 10

## 2020-06-23 MED ORDER — LACTATED RINGERS IV SOLN: INTRAVENOUS | Status: DC

## 2020-06-23 MED ORDER — ONDANSETRON HCL 4 MG PO TABS: 4.0000 mg | ORAL_TABLET | Freq: Four times a day (QID) | ORAL | Status: DC | PRN

## 2020-06-23 MED ORDER — ALUM & MAG HYDROXIDE-SIMETH 200-200-20 MG/5ML PO SUSP
30.0000 mL | Freq: Four times a day (QID) | ORAL | Status: DC | PRN
  Administered 2020-06-24: 30 mL via ORAL
  Filled 2020-06-23: qty 30

## 2020-06-23 MED ORDER — DIPHENHYDRAMINE HCL 12.5 MG/5ML PO ELIX: 12.5000 mg | ORAL_SOLUTION | Freq: Four times a day (QID) | ORAL | Status: DC | PRN

## 2020-06-23 MED ORDER — PROPOFOL 10 MG/ML IV BOLUS
INTRAVENOUS | Status: AC
  Filled 2020-06-23: qty 20

## 2020-06-23 MED ORDER — BUPIVACAINE LIPOSOME 1.3 % IJ SUSP
20.0000 mL | Freq: Once | INTRAMUSCULAR | Status: AC
  Administered 2020-06-23: 20 mL
  Filled 2020-06-23: qty 20

## 2020-06-23 MED ORDER — HYDROMORPHONE HCL 1 MG/ML IJ SOLN
0.5000 mg | INTRAMUSCULAR | Status: DC | PRN
  Administered 2020-06-23 – 2020-06-24 (×4): 0.5 mg via INTRAVENOUS
  Filled 2020-06-23 (×4): qty 0.5

## 2020-06-23 MED ORDER — LIDOCAINE 2% (20 MG/ML) 5 ML SYRINGE
INTRAMUSCULAR | Status: AC
  Filled 2020-06-23: qty 5

## 2020-06-23 MED ORDER — ROCURONIUM BROMIDE 100 MG/10ML IV SOLN
INTRAVENOUS | Status: DC | PRN
  Administered 2020-06-23: 20 mg via INTRAVENOUS
  Administered 2020-06-23: 60 mg via INTRAVENOUS
  Administered 2020-06-23: 10 mg via INTRAVENOUS
  Administered 2020-06-23 (×2): 20 mg via INTRAVENOUS

## 2020-06-23 MED ORDER — PROMETHAZINE HCL 25 MG/ML IJ SOLN: 6.2500 mg | INTRAMUSCULAR | Status: DC | PRN

## 2020-06-23 MED ORDER — ENSURE SURGERY PO LIQD
237.0000 mL | Freq: Two times a day (BID) | ORAL | Status: DC
  Administered 2020-06-24 – 2020-06-25 (×2): 237 mL via ORAL

## 2020-06-23 MED ORDER — ENSURE PRE-SURGERY PO LIQD
592.0000 mL | Freq: Once | ORAL | Status: DC
  Filled 2020-06-23: qty 592

## 2020-06-23 MED ORDER — BUPIVACAINE-EPINEPHRINE (PF) 0.25% -1:200000 IJ SOLN
INTRAMUSCULAR | Status: AC
  Filled 2020-06-23: qty 30

## 2020-06-23 MED ORDER — FENTANYL CITRATE (PF) 250 MCG/5ML IJ SOLN
INTRAMUSCULAR | Status: AC
  Filled 2020-06-23: qty 5

## 2020-06-23 MED ORDER — ACETAMINOPHEN 500 MG PO TABS
1000.0000 mg | ORAL_TABLET | Freq: Four times a day (QID) | ORAL | Status: DC
  Administered 2020-06-24 – 2020-06-26 (×8): 1000 mg via ORAL
  Filled 2020-06-23 (×9): qty 2

## 2020-06-23 MED ORDER — HEPARIN SODIUM (PORCINE) 5000 UNIT/ML IJ SOLN
5000.0000 [IU] | Freq: Three times a day (TID) | INTRAMUSCULAR | Status: DC
  Administered 2020-06-24 – 2020-06-26 (×6): 5000 [IU] via SUBCUTANEOUS
  Filled 2020-06-23 (×6): qty 1

## 2020-06-23 MED ORDER — KETAMINE HCL 10 MG/ML IJ SOLN
INTRAMUSCULAR | Status: AC
  Filled 2020-06-23: qty 1

## 2020-06-23 MED ORDER — ONDANSETRON HCL 4 MG/2ML IJ SOLN
INTRAMUSCULAR | Status: DC | PRN
  Administered 2020-06-23: 4 mg via INTRAVENOUS

## 2020-06-23 MED ORDER — ACETAMINOPHEN 500 MG PO TABS: 1000.0000 mg | ORAL_TABLET | ORAL | Status: DC

## 2020-06-23 MED ORDER — 0.9 % SODIUM CHLORIDE (POUR BTL) OPTIME
TOPICAL | Status: DC | PRN
  Administered 2020-06-23: 2000 mL

## 2020-06-23 MED ORDER — SIMETHICONE 80 MG PO CHEW
40.0000 mg | CHEWABLE_TABLET | Freq: Four times a day (QID) | ORAL | Status: DC | PRN
  Administered 2020-06-23: 40 mg via ORAL
  Filled 2020-06-23: qty 1

## 2020-06-23 MED ORDER — ORAL CARE MOUTH RINSE
15.0000 mL | Freq: Once | OROMUCOSAL | Status: AC
  Administered 2020-06-23: 15 mL via OROMUCOSAL

## 2020-06-23 MED ORDER — FENTANYL CITRATE (PF) 100 MCG/2ML IJ SOLN
INTRAMUSCULAR | Status: AC
  Filled 2020-06-23: qty 2

## 2020-06-23 MED ORDER — POLYETHYLENE GLYCOL 3350 17 GM/SCOOP PO POWD: 1.0000 | Freq: Once | ORAL | Status: DC

## 2020-06-23 MED ORDER — ENSURE PRE-SURGERY PO LIQD: 296.0000 mL | Freq: Once | ORAL | Status: DC

## 2020-06-23 MED ORDER — HYDRALAZINE HCL 20 MG/ML IJ SOLN: 10.0000 mg | INTRAMUSCULAR | Status: DC | PRN

## 2020-06-23 MED ORDER — LACTATED RINGERS IV SOLN: INTRAVENOUS | Status: DC | PRN

## 2020-06-23 MED ORDER — LIDOCAINE HCL 2 % IJ SOLN
INTRAMUSCULAR | Status: AC
  Filled 2020-06-23: qty 20

## 2020-06-23 MED ORDER — CHLORHEXIDINE GLUCONATE CLOTH 2 % EX PADS: 6.0000 | MEDICATED_PAD | Freq: Once | CUTANEOUS | Status: DC

## 2020-06-23 MED ORDER — HEPARIN SODIUM (PORCINE) 5000 UNIT/ML IJ SOLN
INTRAMUSCULAR | Status: AC
  Administered 2020-06-23: 5000 [IU] via SUBCUTANEOUS
  Filled 2020-06-23: qty 1

## 2020-06-23 MED ORDER — ONDANSETRON HCL 4 MG/2ML IJ SOLN: 4.0000 mg | Freq: Four times a day (QID) | INTRAMUSCULAR | Status: DC | PRN

## 2020-06-23 MED ORDER — LABETALOL HCL 5 MG/ML IV SOLN
INTRAVENOUS | Status: DC | PRN
  Administered 2020-06-23 (×2): 2.5 mg via INTRAVENOUS

## 2020-06-23 MED ORDER — ALVIMOPAN 12 MG PO CAPS
12.0000 mg | ORAL_CAPSULE | ORAL | Status: AC
  Administered 2020-06-23: 12 mg via ORAL
  Filled 2020-06-23: qty 1

## 2020-06-23 MED ORDER — IBUPROFEN 400 MG PO TABS
600.0000 mg | ORAL_TABLET | Freq: Four times a day (QID) | ORAL | Status: DC | PRN
  Administered 2020-06-24 – 2020-06-25 (×2): 600 mg via ORAL
  Filled 2020-06-23 (×2): qty 1

## 2020-06-23 MED ORDER — ACETAMINOPHEN 500 MG PO TABS
1000.0000 mg | ORAL_TABLET | Freq: Once | ORAL | Status: AC
  Administered 2020-06-23: 1000 mg via ORAL
  Filled 2020-06-23: qty 2

## 2020-06-23 MED ORDER — FENTANYL CITRATE (PF) 100 MCG/2ML IJ SOLN
INTRAMUSCULAR | Status: DC | PRN
  Administered 2020-06-23: 100 ug via INTRAVENOUS
  Administered 2020-06-23: 50 ug via INTRAVENOUS
  Administered 2020-06-23: 25 ug via INTRAVENOUS
  Administered 2020-06-23: 50 ug via INTRAVENOUS
  Administered 2020-06-23: 25 ug via INTRAVENOUS
  Administered 2020-06-23 (×4): 50 ug via INTRAVENOUS

## 2020-06-23 MED ORDER — BISACODYL 5 MG PO TBEC: 20.0000 mg | DELAYED_RELEASE_TABLET | Freq: Once | ORAL | Status: DC

## 2020-06-23 MED ORDER — METRONIDAZOLE 500 MG PO TABS: 1000.0000 mg | ORAL_TABLET | ORAL | Status: DC

## 2020-06-23 MED ORDER — SUGAMMADEX SODIUM 200 MG/2ML IV SOLN
INTRAVENOUS | Status: DC | PRN
  Administered 2020-06-23: 200 mg via INTRAVENOUS

## 2020-06-23 MED ORDER — DEXAMETHASONE SODIUM PHOSPHATE 10 MG/ML IJ SOLN
INTRAMUSCULAR | Status: AC
  Filled 2020-06-23: qty 1

## 2020-06-23 MED ORDER — NEOMYCIN SULFATE 500 MG PO TABS: 1000.0000 mg | ORAL_TABLET | ORAL | Status: DC

## 2020-06-23 MED ORDER — LACTATED RINGERS IR SOLN
Status: DC | PRN
  Administered 2020-06-23 (×2): 1000 mL

## 2020-06-23 MED ORDER — DEXAMETHASONE SODIUM PHOSPHATE 10 MG/ML IJ SOLN
INTRAMUSCULAR | Status: DC | PRN
  Administered 2020-06-23: 8 mg via INTRAVENOUS

## 2020-06-23 MED ORDER — ONDANSETRON HCL 4 MG/2ML IJ SOLN
INTRAMUSCULAR | Status: AC
  Filled 2020-06-23: qty 2

## 2020-06-23 MED ORDER — SODIUM CHLORIDE 0.9 % IV SOLN
2.0000 g | INTRAVENOUS | Status: AC
  Administered 2020-06-23: 2 g via INTRAVENOUS

## 2020-06-23 MED ORDER — TRAMADOL HCL 50 MG PO TABS: 50.0000 mg | ORAL_TABLET | Freq: Four times a day (QID) | ORAL | Status: DC | PRN

## 2020-06-23 MED ORDER — HEPARIN SODIUM (PORCINE) 5000 UNIT/ML IJ SOLN: 5000.0000 [IU] | Freq: Once | INTRAMUSCULAR | Status: AC

## 2020-06-23 MED ORDER — MIDAZOLAM HCL 5 MG/5ML IJ SOLN
INTRAMUSCULAR | Status: DC | PRN
  Administered 2020-06-23 (×2): 1 mg via INTRAVENOUS

## 2020-06-23 MED ORDER — CHLORHEXIDINE GLUCONATE 0.12 % MT SOLN: 15.0000 mL | Freq: Once | OROMUCOSAL | Status: AC

## 2020-06-23 MED ORDER — LIDOCAINE 2% (20 MG/ML) 5 ML SYRINGE
INTRAMUSCULAR | Status: AC
  Filled 2020-06-23: qty 10

## 2020-06-23 MED ORDER — KETAMINE HCL 10 MG/ML IJ SOLN
INTRAMUSCULAR | Status: DC | PRN
  Administered 2020-06-23 (×3): 10 mg via INTRAVENOUS
  Administered 2020-06-23: 40 mg via INTRAVENOUS
  Administered 2020-06-23: 10 mg via INTRAVENOUS

## 2020-06-23 SURGICAL SUPPLY — 110 items
APPLIER CLIP 5 13 M/L LIGAMAX5 (MISCELLANEOUS)
APPLIER CLIP ROT 10 11.4 M/L (STAPLE)
BLADE EXTENDED COATED 6.5IN (ELECTRODE) ×2 IMPLANT
CANNULA REDUC XI 12-8 STAPL (CANNULA) ×1
CANNULA REDUCER 12-8 DVNC XI (CANNULA) ×1 IMPLANT
CELLS DAT CNTRL 66122 CELL SVR (MISCELLANEOUS) IMPLANT
CHLORAPREP W/TINT 26 (MISCELLANEOUS) ×2 IMPLANT
CLIP APPLIE 5 13 M/L LIGAMAX5 (MISCELLANEOUS) IMPLANT
CLIP APPLIE ROT 10 11.4 M/L (STAPLE) IMPLANT
CLIP VESOLOCK LG 6/CT PURPLE (CLIP) IMPLANT
CLIP VESOLOCK MED LG 6/CT (CLIP) IMPLANT
COVER SURGICAL LIGHT HANDLE (MISCELLANEOUS) ×4 IMPLANT
COVER TIP SHEARS 8 DVNC (MISCELLANEOUS) ×1 IMPLANT
COVER TIP SHEARS 8MM DA VINCI (MISCELLANEOUS) ×1
COVER WAND RF STERILE (DRAPES) IMPLANT
DECANTER SPIKE VIAL GLASS SM (MISCELLANEOUS) IMPLANT
DERMABOND ADVANCED (GAUZE/BANDAGES/DRESSINGS) ×2
DERMABOND ADVANCED .7 DNX12 (GAUZE/BANDAGES/DRESSINGS) ×2 IMPLANT
DEVICE TROCAR PUNCTURE CLOSURE (ENDOMECHANICALS) IMPLANT
DRAIN CHANNEL 19F RND (DRAIN) ×2 IMPLANT
DRAPE ARM DVNC X/XI (DISPOSABLE) ×4 IMPLANT
DRAPE COLUMN DVNC XI (DISPOSABLE) ×1 IMPLANT
DRAPE DA VINCI XI ARM (DISPOSABLE) ×4
DRAPE DA VINCI XI COLUMN (DISPOSABLE) ×1
DRAPE SURG IRRIG POUCH 19X23 (DRAPES) ×2 IMPLANT
DRSG OPSITE POSTOP 4X10 (GAUZE/BANDAGES/DRESSINGS) IMPLANT
DRSG OPSITE POSTOP 4X6 (GAUZE/BANDAGES/DRESSINGS) IMPLANT
DRSG OPSITE POSTOP 4X8 (GAUZE/BANDAGES/DRESSINGS) ×2 IMPLANT
DRSG TEGADERM 4X4.75 (GAUZE/BANDAGES/DRESSINGS) ×2 IMPLANT
ELECT REM PT RETURN 15FT ADLT (MISCELLANEOUS) ×2 IMPLANT
ENDOLOOP SUT PDS II  0 18 (SUTURE)
ENDOLOOP SUT PDS II 0 18 (SUTURE) IMPLANT
EVACUATOR SILICONE 100CC (DRAIN) ×2 IMPLANT
GAUZE SPONGE 2X2 8PLY STRL LF (GAUZE/BANDAGES/DRESSINGS) ×1 IMPLANT
GAUZE SPONGE 4X4 12PLY STRL (GAUZE/BANDAGES/DRESSINGS) IMPLANT
GLOVE SURG ENC MOIS LTX SZ7.5 (GLOVE) ×6 IMPLANT
GLOVE SURG UNDER LTX SZ8 (GLOVE) ×6 IMPLANT
GOWN STRL REUS W/TWL XL LVL3 (GOWN DISPOSABLE) ×10 IMPLANT
GRASPER SUT TROCAR 14GX15 (MISCELLANEOUS) IMPLANT
HOLDER FOLEY CATH W/STRAP (MISCELLANEOUS) ×2 IMPLANT
KIT PROCEDURE DA VINCI SI (MISCELLANEOUS) ×1
KIT PROCEDURE DVNC SI (MISCELLANEOUS) ×1 IMPLANT
KIT TURNOVER KIT A (KITS) ×2 IMPLANT
NEEDLE INSUFFLATION 14GA 120MM (NEEDLE) ×2 IMPLANT
PACK CARDIOVASCULAR III (CUSTOM PROCEDURE TRAY) ×2 IMPLANT
PACK COLON (CUSTOM PROCEDURE TRAY) ×2 IMPLANT
PAD POSITIONING PINK XL (MISCELLANEOUS) ×2 IMPLANT
PENCIL SMOKE EVACUATOR (MISCELLANEOUS) IMPLANT
PORT LAP GEL ALEXIS MED 5-9CM (MISCELLANEOUS) ×2 IMPLANT
PROTECTOR NERVE ULNAR (MISCELLANEOUS) ×4 IMPLANT
RELOAD STAPLER 3.5X45 BLU DVNC (STAPLE) IMPLANT
RELOAD STAPLER 3.5X60 BLU DVNC (STAPLE) IMPLANT
RELOAD STAPLER 4.3X45 GRN DVNC (STAPLE) IMPLANT
RELOAD STAPLER 4.3X60 GRN DVNC (STAPLE) ×2 IMPLANT
RTRCTR WOUND ALEXIS 18CM MED (MISCELLANEOUS)
SCISSORS LAP 5X35 DISP (ENDOMECHANICALS) ×2 IMPLANT
SEAL CANN UNIV 5-8 DVNC XI (MISCELLANEOUS) ×4 IMPLANT
SEAL XI 5MM-8MM UNIVERSAL (MISCELLANEOUS) ×4
SEALER TISSUE G2 STRG ARTC 35C (ENDOMECHANICALS) ×2 IMPLANT
SEALER VESSEL DA VINCI XI (MISCELLANEOUS) ×1
SEALER VESSEL EXT DVNC XI (MISCELLANEOUS) ×1 IMPLANT
SET IRRIG TUBING LAPAROSCOPIC (IRRIGATION / IRRIGATOR) ×4 IMPLANT
SLEEVE ADV FIXATION 5X100MM (TROCAR) IMPLANT
SOLUTION ELECTROLUBE (MISCELLANEOUS) ×2 IMPLANT
SPONGE GAUZE 2X2 STER 10/PKG (GAUZE/BANDAGES/DRESSINGS) ×1
SPONGE LAP 18X18 RF (DISPOSABLE) ×2 IMPLANT
STAPLER 60 DA VINCI SURE FORM (STAPLE) ×1
STAPLER 60 SUREFORM DVNC (STAPLE) ×1 IMPLANT
STAPLER CANNULA SEAL DVNC XI (STAPLE) ×1 IMPLANT
STAPLER CANNULA SEAL XI (STAPLE) ×1
STAPLER ECHELON POWER CIR 29 (STAPLE) ×2 IMPLANT
STAPLER ECHELON POWER CIR 31 (STAPLE) IMPLANT
STAPLER RELOAD 3.5X45 BLU DVNC (STAPLE)
STAPLER RELOAD 3.5X45 BLUE (STAPLE)
STAPLER RELOAD 3.5X60 BLU DVNC (STAPLE)
STAPLER RELOAD 3.5X60 BLUE (STAPLE)
STAPLER RELOAD 4.3X45 GREEN (STAPLE)
STAPLER RELOAD 4.3X45 GRN DVNC (STAPLE)
STAPLER RELOAD 4.3X60 GREEN (STAPLE) ×2
STAPLER RELOAD 4.3X60 GRN DVNC (STAPLE) ×2
STAPLER SHEATH (SHEATH) ×1
STAPLER SHEATH ENDOWRIST DVNC (SHEATH) ×1 IMPLANT
STOPCOCK 4 WAY LG BORE MALE ST (IV SETS) ×4 IMPLANT
SURGILUBE 2OZ TUBE FLIPTOP (MISCELLANEOUS) ×2 IMPLANT
SUT MNCRL AB 4-0 PS2 18 (SUTURE) ×2 IMPLANT
SUT PDS AB 1 CT1 27 (SUTURE) ×6 IMPLANT
SUT PDS AB 1 TP1 96 (SUTURE) IMPLANT
SUT PROLENE 0 CT 2 (SUTURE) IMPLANT
SUT PROLENE 2 0 KS (SUTURE) ×2 IMPLANT
SUT PROLENE 2 0 SH DA (SUTURE) IMPLANT
SUT SILK 2 0 (SUTURE)
SUT SILK 2 0 SH CR/8 (SUTURE) IMPLANT
SUT SILK 2-0 18XBRD TIE 12 (SUTURE) IMPLANT
SUT SILK 3 0 (SUTURE) ×1
SUT SILK 3 0 SH CR/8 (SUTURE) ×4 IMPLANT
SUT SILK 3-0 18XBRD TIE 12 (SUTURE) ×1 IMPLANT
SUT V-LOC BARB 180 2/0GR6 GS22 (SUTURE)
SUT VIC AB 3-0 SH 18 (SUTURE) IMPLANT
SUT VIC AB 3-0 SH 27 (SUTURE)
SUT VIC AB 3-0 SH 27XBRD (SUTURE) IMPLANT
SUT VICRYL 0 UR6 27IN ABS (SUTURE) ×2 IMPLANT
SUTURE V-LC BRB 180 2/0GR6GS22 (SUTURE) IMPLANT
SYR 10ML LL (SYRINGE) ×2 IMPLANT
SYS LAPSCP GELPORT 120MM (MISCELLANEOUS)
SYSTEM LAPSCP GELPORT 120MM (MISCELLANEOUS) IMPLANT
TOWEL OR NON WOVEN STRL DISP B (DISPOSABLE) ×2 IMPLANT
TRAY FOLEY MTR SLVR 16FR STAT (SET/KITS/TRAYS/PACK) ×2 IMPLANT
TROCAR ADV FIXATION 5X100MM (TROCAR) ×2 IMPLANT
TUBING CONNECTING 10 (TUBING) ×4 IMPLANT
TUBING INSUFFLATION 10FT LAP (TUBING) ×2 IMPLANT

## 2020-06-23 NOTE — Op Note (Signed)
PATIENT: Dean Carter  35 y.o. male  Patient Care Team: Tysinger, Camelia Eng, PA-C as PCP - General (Family Medicine)  PREOP DIAGNOSIS: RECURRENT DIVERTICULITIS  POSTOP DIAGNOSIS: RECURRENT DIVERTICULITIS  PROCEDURE:  1. Robotic sigmoidectomy with double stapled colorectal anastomosis 2. Takedown of splenic flexure 3. Intraoperative assessment of perfusion (ICG) 4. Flexible sigmoidoscopy 5. Bilateral transversus abdominus plane blocks  SURGEON: Sharon Mt. Margarito Dehaas, MD  ASSISTANT: Leighton Ruff, MD  ANESTHESIA: General endotracheal  EBL: 200 mL Total I/O In: 2100 [I.V.:2000; IV Piggyback:100] Out: 400 [Urine:200; Blood:200]  DRAINS: None  SPECIMEN: Rectosigmoid colon - open end proximal  COUNTS: Sponge, needle and instrument counts were reported correct x2  FINDINGS:  Significantly diseased segment of sigmoid colon which was adherent to the left abdominal wall.  Long segment of involvement as well with additional involvement on the proximal/descending colon junction.  Given the extent of the involvement, the splenic flexure was mobilized to facilitate a tension-free anastomosis.  There were also abscess cavities identified where the colon was adherent to both the abdominal wall and abscesses within the mesentery. A 29 mm EEA colorectal anastomosis fashioned to the proximal most aspect of rectum, 18 cm from the anal verge by flexible sigmoidoscopy.  NARRATIVE: Informed consent was verified. He was taken to the operating room, placed supine on the operating table and SCD's were applied. General endotracheal anesthesia was induced without difficulty. The patient was then positioned in the lithotomy position with Allen stirrups. Arms were tucked and he was secured to the table. Pressure points were evaluated and padded. A foley catheter was then placed by nursing under sterile conditions. Hair on the abdomen was clipped.  The abdomen was then prepped and draped in the standard  sterile fashion. Surgical timeout was called indicating the correct patient, procedure, positioning and need for preoperative antibiotics.   An OG tube was placed by anesthesia and confirmed to be to suction.  At Palmer's point, a stab incision was created and the Veress needle was introduced into the peritoneal cavity on the first attempt.  Intraperitoneal location was confirmed by the aspiration and saline drop test.  Pneumoperitoneum was established to a maximum pressure of 15 mmHg using CO2.  Following this, the abdomen was marked for planned trocar sites.  Just to the right and cephalad to the umbilicus, an 8 mm incision was created and an 8 mm blunt tipped robotic trocar was cautiously placed into the peritoneal cavity.  The laparoscope was inserted and demonstrated no evidence of trocar site nor Veress needle site complications.  The Veress needle was removed.  Bilateral transversus abdominis plane blocks were then created using a dilute mixture of Exparel with Marcaine.  3 additional 8 mm robotic trochars were placed under direct visualization roughly in a line extending from the right ASIS towards the left upper quadrant. The bladder was inspected and noted to be at/below the pubic symphysis.  Staying 3 fingerbreadths above the pubic symphysis, an incision was created and the 12 mm robotic trocar inserted directed cephalad into the peritoneal cavity under direct visualization.  An additional 5 mm assist port was placed in the right lateral abdomen under direct visualization.  The abdomen was surveyed.  There were some adhesions in the right lower quadrant related to his prior open appendectomy that were carefully lysed sharply.  The sigmoid colon is significantly diseased and there is a long segment of involvement.  It is adherent to the left lower quadrant abdominal wall.  The remainder the abdomen is inspected  and no other pathology is identified.  He was positioned in Trendelenburg with some left side  up.  Small bowel was carefully retracted out of the pelvis.  The robot was then docked and I went to the console.   The sigmoid colon was readily identified.    The colon was densely adherent to the left abdominal wall and this was taken down sharply.  There was an abscess cavity associated with this.  Attachments of the sigmoid colon were taken down from the intersigmoid fossa.  The rectosigmoid colon was grasped and elevated anteriorly.  Beginning with a medial to lateral approach, the peritoneum overlying the presacral space was carefully incised.  The TME plane was readily gained working in a plane between the fascia propria of the rectum and the presacral fascia.  There were some adhesions anteriorly to the rectum that were lysed sharply as well and appear to be related to a prior pelvic space abscess.  Posteriorly, hypogastric nerves were seen going along the the presacral fascia and were protected free of injury.  Working more proximally, the mesorectum and sigmoid mesentery were carefully mobilized off of the peritoneum.  The left ureter was identified and protected free of injury.  The left gonadal vessels were identified and protected.  These were both swept "down."  The superior hemorrhoidal and IMA pedicles were identified. Further mesocolon was mobilized proximally staying in this plane between the retroperitoneum proper and the mesocolon. Attention was then turned to the lateral portion of dissection.  The sigmoid colon was retracted to the right.  The sigmoid colon was fully mobilized and working proximal to the diseased segment of colon, the descending colon was mobilized by incising the Clista Rainford line of Toldt.  There was a significant disease segment extending onto the proximal sigmoid colon where it met the descending colon. This was done all the way up to the level of the splenic flexure.  The associated mesocolon was also mobilized medially.  The left ureter again was confirmed to be well away for  plan dissection and well away from the vasculature which had been dissected medially.  The rectosigmoid colon was elevated anteriorly. The left ureter was re-identified. The IMA was clear of this. The IMA was then divided with the vessel sealer. The stump was inspected and noted to be completely hemostatic with a good seal.  The mesentery was divided out to the point of planned proximal division.  It was apparent at this point that a tension-free anastomosis would not be possible without mobilization of the splenic flexure.  Additional attachments were then taken down from the splenic flexure from a lateral approach.  He was then positioned in reverse Trendelenburg.  The omentum was elevated anteriorly and the transverse colon retracted caudad.  The lesser sac was opened and the omentum reflected off the distal transverse colon.  The splenic flexure was then completely mobilized from this approach as well.  Returning to the pelvis, the rectum was identified where the tinea had splayed and there were loss of appendices epiploica.  This also corresponded to a location overlying the sacral promontory.  Anatomically, this clearly represents the proximal rectum and is healthy and normal in appearance.  The mesentery out to this level was then cleared using the vessel sealer. The distal point of transection on the proximal rectum was identified.   A 60 mm green load robotic stapler was then placed through the 12 mm port and introduced into the peritoneal cavity.  The rectum was divided  with 2 firings of the stapler.  The stump was intact and healthy in appearance.  Ultimately, additional attachments were mobilized of the mesocolon over the Gerota's fascia of the left kidney.  This allowed the colon to reach into the pelvis without any tension.   Attention was turned to performing a perfusion test. ICG was administered by anesthesia and at the level of the cleared mesentery proximally, there was excellent uptake of the  tracer.  The rectum was also well perfused in appearance.  There was a visible pulse in the mesentery out to the level of the cleared colon at the level of the proximal sigmoid/descending colon junction.  This colon is also supple and healthy in appearance without any thickening. A locking grasper was then placed on the sigmoid staple line.   Attention was turned to the extracorporeal portion of the procedure.  The robot was undocked.  I scrubbed back in.  Using the 12 mm trocar site, a Pfannenstiel incision was created and incorporated the fascial opening through the 12 mm port site.  The rectus fascia was incised and then elevated.  The rectus muscle was mobilized free of the overlying fascia.  The peritoneum was incised in the midline well above the location of the bladder.  An Blythe wound protector was placed.  Towels were placed around the field.  The divided colon was passed through the wound protector.  The point of proximal division was identified and was again on a healthy segment of supple colon with a palpable pulse in the mesentery. This was pink in color.  Given his habitus, there is difficulty getting this to fully reach out into the extracorporeal field.  In order to ensure that no tension was being placed on the mesentery during this portion of the procedure, the Pfannenstiel incision was "T" off in the midline. The associated fascia opened here as well in the midline.  There is now adequate reach of the colon into the extracorporeal portion of the field. A pursestring device was applied.  A 2-0 Prolene on a Keith needle was passed.  The colon was divided and passed off with the open end being proximal.  EEA sizers were then introduced and a 29 mm EEA selected.  "Belt loops" consisting of 3-0 silk were placed around the pursestring suture line.  The anvil was placed and the pursestring tied.  A small amount of fat was cleared from the planned anastomosis and no diverticula were apparent within  this.  This was placed back into the abdomen and a cap placed over the wound protector port site.  Pneumoperitoneum was reestablished.  I then went below to pass the stapler.  My partner remained above.  EEA sizers were cautiously passed trans-anally under direct visualization.  The stapler was passed and the spike deployed just anterior to the staple line.  The components were then mated.  Orientation was confirmed such that there is no twisting of the colon nor small bowel underneath the mesenteric defect. Care was taken to ensure no other structures were incorporated within this either.  The stapler was then closed, held, and fired. This was then removed. The donuts were inspected and noted to be complete.  The colon proximal to the anastomosis was then gently occluded. The pelvis was filled with sterile irrigation. Under direct visualization, I passed a flexible sigmoidoscope.  The anastomosis was under water.  With good distention of the anastomosis there was no air leak. The anastomosis pink in appearance.  This is  located at 18 cm from the anal verge by flexible sigmoidoscopy.  It is hemostatic.  Additionally, looking from above, there is no tension on the colon or mesentery.  Sigmoidoscope was withdrawn.  Irrigation was evacuated from the pelvis.  The abdomen and pelvis are surveyed and noted to be completely hemostatic without any apparent injury.  Under direct visualization, all trochars are removed.  The [place] wound protector was removed.  Gowns/gloves are changed and a fresh set of clean instruments utilized. Additional sterile drapes were placed around the field.   Sponge, needle, and instrument counts were reported correct.  The Pfannenstiel peritoneum was closed with a running 0 Vicryl suture.  The midline rectus fascia was then closed with a running #1 PDS suture. The Pfannenstiel rectus fascia then closed using 2 running #1 PDS sutures.  The fascia was then palpated and noted to be completely  closed.  Additional anesthetic was infiltrated at the Pfannenstiel site.  Sponge, needle, and instrument counts were then again reported correct. 4-0 Monocryl subcuticular suture was used to close the skin of all incision sites.  Dermabond was placed over all incisions.  A honeycomb dressing placed over the Pfannenstiel as well.   He is taken out of lithotomy, awakened from anesthesia, extubated, and transferred to a stretcher for transport to PACU in satisfactory condition having tolerated the procedure well.

## 2020-06-23 NOTE — H&P (Signed)
CC: Recurrent diverticulitis  HPI: Mr. Economou is a very pleasant 35 year old referred to [place] by Dr. Bryan Lemma for history of recurrent diverticulitis. He reports his symptoms began back in 2014 with sharp severe left lower quadrant pain that he reports having had a CT scan done in [place] for and was told he had diverticulitis. He reports that he's had at least 3-4 attacks of this can and pain per year over the last 4 days. He will often times taking a bionics we will gradually help the symptoms entirely resolved. He believes that his pain has been getting worse with his attacks more recently. He has been following with gastroenterology and underwent a colonoscopy last year (04/2018) which is notable for diverticulosis as well as some erythema/thickening in the sigmoid colon 30-40 cm from the anal verge. Biopsies were benign polypoid mucosa. His last attack of this kind of pain which lasted for 3 or 4 days was 12/2019. He underwent CT scan with Dr. Bryan Lemma which demonstrated sigmoid diverticulitis complicated features. He was subsequently referred to see [place] for his multiple recurrent attacks. At this time, he denies any fever/chills/nausea/vomiting. He has minimal if any abdominal discomfort and left lower quadrant. He denies any history of blood in his stool and weight loss. He denies any family history of known cancers, ulcers or colitis, or Crohn's.  INTERVAL HX He denies any changes in his health or health history since we met in the office. He denies any abdominal pain or recurrent attacks since November 2021. He tolerated the bowel prep with a good result. States he is ready for surgery.  PMH: Diverticulitis  PSH: Open appendectomy 25 years ago as a child  FHx: Denies FHx of colorectal, breast, endometrial, ovarian or cervical cancer  Social: Denies use of tobacco/drugs. Vapes. Rare social EtOH use. 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  ROS: A comprehensive 10  system review of systems was completed with the patient and pertinent findings as noted above.  Past Medical History:  Diagnosis Date  . Allergy    RHINITIS  . Diverticulitis 03/2012   few occurrences as of 05/2015  . Farsightedness    wears contacts    Past Surgical History:  Procedure Laterality Date  . APPENDECTOMY    . WISDOM TOOTH EXTRACTION      Family History  Problem Relation Age of Onset  . Diabetes Mother        TYPE 2  . Arthritis Father   . Heart disease Paternal Uncle        CABG  . Stroke Maternal Grandmother   . Heart disease Paternal Grandfather        CABG  . Cancer Neg Hx        no colon or esophagus   . Hypertension Neg Hx   . Hyperlipidemia Neg Hx     Social:  reports that he has never smoked. He has never used smokeless tobacco. He reports current alcohol use of about 5.0 standard drinks of alcohol per week. He reports that he does not use drugs.  Allergies: No Known Allergies  Medications: I have reviewed the patient's current medications.  Results for orders placed or performed during the hospital encounter of 06/21/20 (from the past 48 hour(s))  SARS CORONAVIRUS 2 (TAT 6-24 HRS) Nasopharyngeal Nasopharyngeal Swab     Status: None   Collection Time: 06/21/20  1:28 PM   Specimen: Nasopharyngeal Swab  Result Value Ref Range   SARS Coronavirus 2 NEGATIVE NEGATIVE    Comment: (  NOTE) SARS-CoV-2 target nucleic acids are NOT DETECTED.  The SARS-CoV-2 RNA is generally detectable in upper and lower respiratory specimens during the acute phase of infection. Negative results do not preclude SARS-CoV-2 infection, do not rule out co-infections with other pathogens, and should not be used as the sole basis for treatment or other patient management decisions. Negative results must be combined with clinical observations, patient history, and epidemiological information. The expected result is Negative.  Fact Sheet for  Patients: SugarRoll.be  Fact Sheet for Healthcare Providers: https://www.woods-mathews.com/  This test is not yet approved or cleared by the [place] FDA and  has been authorized for detection and/or diagnosis of SARS-CoV-2 by FDA under an Emergency Use Authorization (EUA). This EUA will remain  in effect (meaning this test can be used) for the duration of the COVID-19 declaration under Se ction 564(b)(1) of the Act, 21 U.S.C. section 360bbb-3(b)(1), unless the authorization is terminated or revoked sooner.  Performed at [facility], [place] [address] [place], [place] [address]     No results found.  ROS - all of the below systems have been reviewed with the patient and positives are indicated with bold text General: chills, fever or night sweats Eyes: blurry vision or double vision ENT: epistaxis or sore throat Allergy/Immunology: itchy/watery eyes or nasal congestion Hematologic/Lymphatic: bleeding problems, blood clots or swollen lymph nodes Endocrine: temperature intolerance or unexpected weight changes Breast: new or changing breast lumps or nipple discharge Resp: cough, shortness of breath, or wheezing CV: chest pain or dyspnea on exertion GI: as per HPI GU: dysuria, trouble voiding, or hematuria MSK: joint pain or joint stiffness Neuro: TIA or stroke symptoms Derm: pruritus and skin lesion changes Psych: anxiety and depression  PE Blood pressure 138/85, pulse 81, temperature 98.2 F (36.8 C), temperature source Oral, resp. rate 17, SpO2 100 %. Constitutional: NAD; conversant; no deformities Eyes: Moist conjunctiva; no lid lag; anicteric; PERRL Neck: Trachea midline Lungs: Normal respiratory effort CV: RRR; no pitting edema GI: Abd soft, NT/ND; no palpable hepatosplenomegaly MSK: Normal range of motion of extremities; no clubbing/cyanosis Psychiatric: Appropriate affect; alert and oriented x3  Results for orders  placed or performed during the hospital encounter of 06/21/20 (from the past 48 hour(s))  SARS CORONAVIRUS 2 (TAT 6-24 HRS) Nasopharyngeal Nasopharyngeal Swab     Status: None   Collection Time: 06/21/20  1:28 PM   Specimen: Nasopharyngeal Swab  Result Value Ref Range   SARS Coronavirus 2 NEGATIVE NEGATIVE    Comment: (NOTE) SARS-CoV-2 target nucleic acids are NOT DETECTED.  The SARS-CoV-2 RNA is generally detectable in upper and lower respiratory specimens during the acute phase of infection. Negative results do not preclude SARS-CoV-2 infection, do not rule out co-infections with other pathogens, and should not be used as the sole basis for treatment or other patient management decisions. Negative results must be combined with clinical observations, patient history, and epidemiological information. The expected result is Negative.  Fact Sheet for Patients: SugarRoll.be  Fact Sheet for Healthcare Providers: https://www.woods-mathews.com/  This test is not yet approved or cleared by the [place] FDA and  has been authorized for detection and/or diagnosis of SARS-CoV-2 by FDA under an Emergency Use Authorization (EUA). This EUA will remain  in effect (meaning this test can be used) for the duration of the COVID-19 declaration under Se ction 564(b)(1) of the Act, 21 U.S.C. section 360bbb-3(b)(1), unless the authorization is terminated or revoked sooner.  Performed at [facility], [place]  [address] [place], [place] [address]     No results found.   A/P: Mr. Yniguez is a very pleasant 51yoM with hx of numerous prior bouts of 'uncomplicated' diverticulitis - totaling >20 over last 7 years Colonoscopy 04/2018 - no evidence of malignancy; benign polypoid sigmoid mucosa on biopsy - no clear evidence of IBD either   -The anatomy and physiology of the GI tract was discussed at length with the patient. The pathophysiology of  diverticulitis was discussed at length with associated pictures. -We have reviewed options going forward. We discussed that with his history of uncomplicated diverticulitis, surgery being considered for quality of life reasons. We discussed that there is no magic number of attacks. We discussed the potential benefits of surgery with regards to significant reduction in the risk of recurrence. He is interested in considering surgery at this point but has requested some additional time to think about everything. We did discuss with his young age and frequency/numerous attacks, the potential for long-term development of a sigmoid colon stricture and what this may entail. -We went ahead and reviewed robotic sigmoidectomy; flexible sigmoidoscopy; scenarios where an 'open' approach may be necessary -The planned procedures, material risks (including, but not limited to, pain, bleeding, infection, scarring, need for blood transfusion, damage to surrounding structures- blood vessels/nerves/viscus/organs, damage to ureter, urine leak, leak from anastomosis, need for additional procedures, worsening of pre-existing medical conditions, scenarios where a stoma may be necessary and in uncommon cases can be permanent, hernia, recurrence, DVT/PE, pneumonia, heart attack, stroke, death) benefits and alternatives to surgery were discussed at length. I noted a good probability that the procedure would help improve his symptoms as noted above. The patient's questions were answered to his satisfaction, he voiced understanding and will let us know his decision. Additionally, we discussed typical postoperative expectations and the recovery process.  Nadeen Landau, MD [facility] Use [email or web address] to contact on call provider

## 2020-06-23 NOTE — Anesthesia Procedure Notes (Signed)
Procedure Name: Intubation Date/Time: 06/23/2020 12:56 PM Performed by: Garrel Ridgel, CRNA Pre-anesthesia Checklist: Patient identified, Emergency Drugs available, Suction available and Patient being monitored Patient Re-evaluated:Patient Re-evaluated prior to induction Oxygen Delivery Method: Circle system utilized Preoxygenation: Pre-oxygenation with 100% oxygen Induction Type: IV induction Ventilation: Mask ventilation without difficulty Laryngoscope Size: Mac and 4 Grade View: Grade II Tube type: Oral Tube size: 7.5 mm Number of attempts: 1 Airway Equipment and Method: Stylet and Oral airway Placement Confirmation: ETT inserted through vocal cords under direct vision,  positive ETCO2 and breath sounds checked- equal and bilateral Secured at: 24 cm Dental Injury: Teeth and Oropharynx as per pre-operative assessment

## 2020-06-23 NOTE — Transfer of Care (Signed)
Immediate Anesthesia Transfer of Care Note  Patient: Dean Carter  Procedure(s) Performed: XI ROBOT ASSISTED SIGMOIDECTOMY/TAKE DOWN OF SPLENIC FLEXURE X3/INTRAOPERATIVE ASSESSMENT OF PERFUSION (N/A ) FLEXIBLE SIGMOIDOSCOPY (N/A )  Patient Location: PACU  Anesthesia Type:General  Level of Consciousness: awake, drowsy and patient cooperative  Airway & Oxygen Therapy: Patient Spontanous Breathing and Patient connected to face mask oxygen  Post-op Assessment: Report given to RN and Post -op Vital signs reviewed and stable  Post vital signs: Reviewed and stable  Last Vitals:  Vitals Value Taken Time  BP 153/102 06/23/20 1806  Temp    Pulse 99 06/23/20 1811  Resp 22 06/23/20 1811  SpO2 95 % 06/23/20 1811  Vitals shown include unvalidated device data.  Last Pain:  Vitals:   06/23/20 1111  TempSrc: Oral         Complications: No complications documented.

## 2020-06-23 NOTE — Anesthesia Preprocedure Evaluation (Addendum)
Anesthesia Evaluation  Patient identified by MRN, date of birth, ID band Patient awake    Reviewed: Allergy & Precautions, NPO status , Patient's Chart, lab work & pertinent test results  History of Anesthesia Complications Negative for: history of anesthetic complications  Airway Mallampati: III  TM Distance: >3 FB Neck ROM: Full    Dental no notable dental hx.    Pulmonary neg pulmonary ROS,    Pulmonary exam normal        Cardiovascular negative cardio ROS Normal cardiovascular exam     Neuro/Psych negative neurological ROS  negative psych ROS   GI/Hepatic Neg liver ROS, Diverticulitis   Endo/Other  negative endocrine ROS  Renal/GU negative Renal ROS  negative genitourinary   Musculoskeletal negative musculoskeletal ROS (+)   Abdominal   Peds  Hematology negative hematology ROS (+)   Anesthesia Other Findings Day of surgery medications reviewed with patient.  Reproductive/Obstetrics negative OB ROS                            Anesthesia Physical Anesthesia Plan  ASA: II  Anesthesia Plan: General   Post-op Pain Management:    Induction: Intravenous  PONV Risk Score and Plan: 4 or greater and Treatment may vary due to age or medical condition, Ondansetron, Dexamethasone and Midazolam  Airway Management Planned: Oral ETT  Additional Equipment: None  Intra-op Plan:   Post-operative Plan: Extubation in OR  Informed Consent: I have reviewed the patients History and Physical, chart, labs and discussed the procedure including the risks, benefits and alternatives for the proposed anesthesia with the patient or authorized representative who has indicated his/her understanding and acceptance.     Dental advisory given  Plan Discussed with: CRNA  Anesthesia Plan Comments:        Anesthesia Quick Evaluation

## 2020-06-24 ENCOUNTER — Encounter (HOSPITAL_COMMUNITY): Payer: Self-pay | Admitting: Surgery

## 2020-06-24 ENCOUNTER — Other Ambulatory Visit: Payer: Self-pay

## 2020-06-24 LAB — BASIC METABOLIC PANEL
Anion gap: 9 (ref 5–15)
BUN: 11 mg/dL (ref 6–20)
CO2: 24 mmol/L (ref 22–32)
Calcium: 8.7 mg/dL — ABNORMAL LOW (ref 8.9–10.3)
Chloride: 104 mmol/L (ref 98–111)
Creatinine, Ser: 1.17 mg/dL (ref 0.61–1.24)
GFR, Estimated: >60 mL/min (ref >60)
Glucose, Bld: 130 mg/dL — ABNORMAL HIGH (ref 70–99)
Potassium: 4.3 mmol/L (ref 3.5–5.1)
Sodium: 137 mmol/L (ref 135–145)

## 2020-06-24 LAB — CBC
HCT: 42.8 % (ref 39.0–52.0)
Hemoglobin: 14.2 g/dL (ref 13.0–17.0)
MCH: 28.6 pg (ref 26.0–34.0)
MCHC: 33.2 g/dL (ref 30.0–36.0)
MCV: 86.1 fL (ref 80.0–100.0)
Platelets: 279 10*3/uL (ref 150–400)
RBC: 4.97 MIL/uL (ref 4.22–5.81)
RDW: 14 % (ref 11.5–15.5)
WBC: 15 10*3/uL — ABNORMAL HIGH (ref 4.0–10.5)
nRBC: 0 % (ref 0.0–0.2)

## 2020-06-24 MED ORDER — CHLORHEXIDINE GLUCONATE CLOTH 2 % EX PADS: 6.0000 | MEDICATED_PAD | Freq: Every day | CUTANEOUS | Status: DC

## 2020-06-24 MED ORDER — TRAMADOL HCL 50 MG PO TABS: 50.0000 mg | ORAL_TABLET | Freq: Four times a day (QID) | ORAL | 0 refills | Status: AC | PRN

## 2020-06-24 NOTE — Anesthesia Postprocedure Evaluation (Signed)
Anesthesia Post Note  Patient: Dean Carter  Procedure(s) Performed: XI ROBOT ASSISTED SIGMOIDECTOMY/TAKE DOWN OF SPLENIC FLEXURE X3/INTRAOPERATIVE ASSESSMENT OF PERFUSION (N/A ) FLEXIBLE SIGMOIDOSCOPY (N/A )     Patient location during evaluation: PACU Anesthesia Type: General Level of consciousness: awake and alert and oriented Pain management: pain level controlled Vital Signs Assessment: post-procedure vital signs reviewed and stable Respiratory status: spontaneous breathing, nonlabored ventilation and respiratory function stable Cardiovascular status: blood pressure returned to baseline Postop Assessment: no apparent nausea or vomiting Anesthetic complications: no   No complications documented.           Kaylyn Layer

## 2020-06-24 NOTE — Plan of Care (Signed)

## 2020-06-24 NOTE — Discharge Instructions (Signed)
POST OP INSTRUCTIONS AFTER COLON SURGERY  1. DIET: Be sure to include lots of fluids daily to stay hydrated - 64oz of water per day (8, 8 oz glasses).  Avoid fast food or heavy meals for the first couple of weeks as your are more likely to get nauseated. Avoid raw/uncooked fruits or vegetables for the first 4 weeks (its ok to have these if they are blended into smoothie form). If you have fruits/vegetables, make sure they are cooked until soft enough to mash on the roof of your mouth and chew your food well. Otherwise, diet as tolerated.  2. Take your usually prescribed home medications unless otherwise directed.  3. PAIN CONTROL: a. Pain is best controlled by a usual combination of three different methods TOGETHER: i. Ice/Heat ii. Over the counter pain medication iii. Prescription pain medication b. Most patients will experience some swelling and bruising around the surgical site.  Ice packs or heating pads (30-60 minutes up to 6 times a day) will help. Some people prefer to use ice alone, heat alone, alternating between ice & heat.  Experiment to what works for you.  Swelling and bruising can take several weeks to resolve.   c. It is helpful to take an over-the-counter pain medication regularly for the first few weeks: i. Ibuprofen (Motrin/Advil) - 200mg tabs - take 3 tabs (600mg) every 6 hours as needed for pain (unless you have been directed previously to avoid NSAIDs/ibuprofen) ii. Acetaminophen (Tylenol) - you may take 650mg every 6 hours as needed. You can take this with motrin as they act differently on the body. If you are taking a narcotic pain medication that has acetaminophen in it, do not take over the counter tylenol at the same time. iii. NOTE: You may take both of these medications together - most patients  find it most helpful when alternating between the two (i.e. Ibuprofen at 6am, tylenol at 9am, ibuprofen at 12pm ...) d. A  prescription for pain medication should be given to you  upon discharge.  Take your pain medication as prescribed if your pain is not adequatly controlled with the over-the-counter pain reliefs mentioned above.  4. Avoid getting constipated.  Between the surgery and the pain medications, you may experience some constipation.  Increasing fluid intake and taking a fiber supplement (such as Metamucil, Citrucel, FiberCon, MiraLax, etc) 1-2 times a day regularly will usually help prevent this problem from occurring.  A mild laxative (prune juice, Milk of Magnesia, MiraLax, etc) should be taken according to package directions if there are no bowel movements after 48 hours.  Looser stools after colorectal surgery are also possible. Often taking metamucil can help add bulk/substance to your stool and may actually help this as well  5. Dressing: Your incisions are covered in Dermabond which is like sterile superglue for the skin. This will come off on it's own in a couple weeks. It is waterproof and you may bathe normally starting the day after your surgery in a shower. Avoid baths/pools/lakes/oceans until your wounds have fully healed.  6. ACTIVITIES as tolerated:   a. Avoid heavy lifting (>10lbs or 1 gallon of milk) for the next 6 weeks. b. You may resume regular daily activities as tolerated--such as daily self-care, walking, climbing stairs--gradually increasing activities as tolerated.  If you can walk 30 minutes without difficulty, it is safe to try more intense activity such as jogging, treadmill, bicycling, low-impact aerobics.  c. DO NOT PUSH THROUGH PAIN.  Let pain be your guide: If   it hurts to do something, don't do it. d. You may drive when you are no longer taking prescription pain medication, you can comfortably wear a seatbelt, and you can safely maneuver your car and apply brakes.  7. FOLLOW UP in our office a. Please call [facility] at [phone] to set up an appointment to see your surgeon in the office for a follow-up appointment approximately 2 weeks  after your surgery. b. Make sure that you call for this appointment the day you arrive home to insure a convenient appointment time.  9. If you have disability or family leave forms that need to be completed, you may have them completed by your primary care physician's office; for return to work instructions, please ask our office staff and they will be happy to assist you in obtaining this documentation   When to call us [phone]: 1. Poor pain control 2. Reactions / problems with new medications (rash/itching, etc)  3. Fever over 101.5 F (38.5 C) 4. Inability to urinate 5. Nausea/vomiting 6. Worsening swelling or bruising 7. Continued bleeding from incision. 8. Increased pain, redness, or drainage from the incision  The clinic staff is available to answer your questions during regular business hours (8:30am-5pm).  Please don't hesitate to call and ask to speak to one of our nurses for clinical concerns.   A surgeon from [facility] is always on call at the hospitals   If you have a medical emergency, go to the nearest emergency room or call 911.  [facility] [address], [address], [place], [place]  [address] MAIN: [phone] FAX: [phone] [email or web address] 

## 2020-06-24 NOTE — Progress Notes (Signed)
Subjective No acute events. Tolerating clear liquids without nausea/vomiting. Ambulating. Began passing flatus this morning. No BM yet.  Objective: Vital signs in last 24 hours: Temp:  [97.6 F (36.4 C)-99.5 F (37.5 C)] 98.3 F (36.8 C) (04/21 0546) Pulse Rate:  [81-104] 84 (04/21 0546) Resp:  [7-24] 18 (04/21 0546) BP: (98-158)/(71-111) 115/82 (04/21 0546) SpO2:  [93 %-100 %] 98 % (04/21 0546)    Intake/Output from previous day: 04/20 0701 - 04/21 0700 In: 4374.8 [P.O.:720; I.V.:3554.8; IV Piggyback:100] Out: 2925 [Urine:2725; Blood:200] Intake/Output this shift: No intake/output data recorded.  Gen: NAD, comfortable CV: RRR Pulm: Normal work of breathing Abd: Soft, incisional tenderness, nondistended Ext: SCDs in place  Lab Results: CBC  Recent Labs    06/24/20 0432  WBC 15.0*  HGB 14.2  HCT 42.8  PLT 279   BMET Recent Labs    06/24/20 0432  NA 137  K 4.3  CL 104  CO2 24  GLUCOSE 130*  BUN 11  CREATININE 1.17  CALCIUM 8.7*   PT/INR No results for input(s): LABPROT, INR in the last 72 hours. ABG No results for input(s): PHART, HCO3 in the last 72 hours.  Invalid input(s): PCO2, PO2  Studies/Results:  Anti-infectives: Anti-infectives (From admission, onward)   Start     Dose/Rate Route Frequency Ordered Stop   06/23/20 1400  neomycin (MYCIFRADIN) tablet 1,000 mg  Status:  Discontinued       "And" Linked Group Details   1,000 mg Oral 3 times per day 06/23/20 1103 06/23/20 1123   06/23/20 1400  metroNIDAZOLE (FLAGYL) tablet 1,000 mg  Status:  Discontinued       "And" Linked Group Details   1,000 mg Oral 3 times per day 06/23/20 1103 06/23/20 1123   06/23/20 1121  sodium chloride 0.9 % with cefoTEtan (CEFOTAN) ADS Med       Note to Pharmacy: Viviano Simas   : cabinet override      06/23/20 1121 06/23/20 1253   06/23/20 1115  cefoTEtan (CEFOTAN) 2 g in sodium chloride 0.9 % 100 mL IVPB        2 g 200 mL/hr over 30 Minutes Intravenous On call  to O.R. 06/23/20 1103 06/23/20 1303       Assessment/Plan: Patient Active Problem List   Diagnosis Date Noted  . S/P laparoscopic-assisted sigmoidectomy 06/23/2020  . Diverticulitis of large intestine without bleeding 03/10/2019  . LLQ abdominal pain 03/10/2019  . Chills 03/10/2019  . Diverticulitis 03/22/2018  . Exophthalmos 03/22/2018  . Low serum HDL 09/29/2016  . Routine general medical examination at a health care facility 05/17/2015  . Screen for STD (sexually transmitted disease) 05/17/2015  . Impaired fasting blood sugar 05/17/2015  . Obesity 05/17/2015  . Influenza vaccination declined 05/17/2015   s/p Procedure(s): XI ROBOT ASSISTED SIGMOIDECTOMY/TAKE DOWN OF SPLENIC FLEXURE X3/INTRAOPERATIVE ASSESSMENT OF PERFUSION FLEXIBLE SIGMOIDOSCOPY 06/23/2020  -We spent time reviewing his procedure, findings and expectations -He is doing quite well -Ambulate 5x/day -D/C Foley -Advancing diet as tolerated -PPx: SQH, SCDs   LOS: 1 day   Marin Olp, MD [facility] Use [email or web address] to contact on call provider

## 2020-06-24 NOTE — Progress Notes (Signed)
Patient arrived to unit via gurney accompanied by PACU RN. Patient alert and not appearing to be in any acute distress. PIV X 2 intact, O2 intact, and SCDs intact. Vitals stable on 3L. Pt assessed, plan of care and orders reviewed. Family at bedside. Will continue to assess.

## 2020-06-25 LAB — CBC
HCT: 43.2 % (ref 39.0–52.0)
Hemoglobin: 14 g/dL (ref 13.0–17.0)
MCH: 28.3 pg (ref 26.0–34.0)
MCHC: 32.4 g/dL (ref 30.0–36.0)
MCV: 87.4 fL (ref 80.0–100.0)
Platelets: 213 10*3/uL (ref 150–400)
RBC: 4.94 MIL/uL (ref 4.22–5.81)
RDW: 13.9 % (ref 11.5–15.5)
WBC: 9.3 10*3/uL (ref 4.0–10.5)
nRBC: 0 % (ref 0.0–0.2)

## 2020-06-25 LAB — BASIC METABOLIC PANEL
Anion gap: 10 (ref 5–15)
BUN: 12 mg/dL (ref 6–20)
CO2: 26 mmol/L (ref 22–32)
Calcium: 9.1 mg/dL (ref 8.9–10.3)
Chloride: 105 mmol/L (ref 98–111)
Creatinine, Ser: 1.05 mg/dL (ref 0.61–1.24)
GFR, Estimated: >60 mL/min (ref >60)
Glucose, Bld: 104 mg/dL — ABNORMAL HIGH (ref 70–99)
Potassium: 4.1 mmol/L (ref 3.5–5.1)
Sodium: 141 mmol/L (ref 135–145)

## 2020-06-25 LAB — SURGICAL PATHOLOGY

## 2020-06-25 NOTE — Progress Notes (Signed)
Subjective No acute events. Tolerating full liquids without nausea/vomiting. Ambulating. Continues to pass flatus.  Objective: Vital signs in last 24 hours: Temp:  [98 F (36.7 C)-98.9 F (37.2 C)] 98 F (36.7 C) (04/22 0449) Pulse Rate:  [76-91] 76 (04/22 0449) Resp:  [15-19] 16 (04/22 0449) BP: (124-137)/(71-85) 133/85 (04/22 0449) SpO2:  [95 %-97 %] 97 % (04/22 0449)    Intake/Output from previous day: 04/21 0701 - 04/22 0700 In: 600 [P.O.:600] Out: 650 [Urine:650] Intake/Output this shift: No intake/output data recorded.  Gen: NAD, comfortable CV: RRR Pulm: Normal work of breathing Abd: Soft, incisional tenderness, nondistended, incisions c/d/i without erythema Ext: SCDs in place  Lab Results: CBC  Recent Labs    06/24/20 0432 06/25/20 0417  WBC 15.0* 9.3  HGB 14.2 14.0  HCT 42.8 43.2  PLT 279 213   BMET Recent Labs    06/24/20 0432 06/25/20 0417  NA 137 141  K 4.3 4.1  CL 104 105  CO2 24 26  GLUCOSE 130* 104*  BUN 11 12  CREATININE 1.17 1.05  CALCIUM 8.7* 9.1   PT/INR No results for input(s): LABPROT, INR in the last 72 hours. ABG No results for input(s): PHART, HCO3 in the last 72 hours.  Invalid input(s): PCO2, PO2  Studies/Results:  Anti-infectives: Anti-infectives (From admission, onward)   Start     Dose/Rate Route Frequency Ordered Stop   06/23/20 1400  neomycin (MYCIFRADIN) tablet 1,000 mg  Status:  Discontinued       "And" Linked Group Details   1,000 mg Oral 3 times per day 06/23/20 1103 06/23/20 1123   06/23/20 1400  metroNIDAZOLE (FLAGYL) tablet 1,000 mg  Status:  Discontinued       "And" Linked Group Details   1,000 mg Oral 3 times per day 06/23/20 1103 06/23/20 1123   06/23/20 1121  sodium chloride 0.9 % with cefoTEtan (CEFOTAN) ADS Med       Note to Pharmacy: Viviano Simas   : cabinet override      06/23/20 1121 06/23/20 1253   06/23/20 1115  cefoTEtan (CEFOTAN) 2 g in sodium chloride 0.9 % 100 mL IVPB        2 g 200  mL/hr over 30 Minutes Intravenous On call to O.R. 06/23/20 1103 06/23/20 1303       Assessment/Plan: Patient Active Problem List   Diagnosis Date Noted  . S/P laparoscopic-assisted sigmoidectomy 06/23/2020  . Diverticulitis of large intestine without bleeding 03/10/2019  . LLQ abdominal pain 03/10/2019  . Chills 03/10/2019  . Diverticulitis 03/22/2018  . Exophthalmos 03/22/2018  . Low serum HDL 09/29/2016  . Routine general medical examination at a health care facility 05/17/2015  . Screen for STD (sexually transmitted disease) 05/17/2015  . Impaired fasting blood sugar 05/17/2015  . Obesity 05/17/2015  . Influenza vaccination declined 05/17/2015   s/p Procedure(s): XI ROBOT ASSISTED SIGMOIDECTOMY/TAKE DOWN OF SPLENIC FLEXURE X3/INTRAOPERATIVE ASSESSMENT OF PERFUSION FLEXIBLE SIGMOIDOSCOPY 06/23/2020  -He continues to do quite well  -Ambulate 5x/day -Soft diet as tolerated -PPx: SQH, SCDs -Dispo: possible d/c later today vs tomorrow based on how he's doing   LOS: 2 days   Marin Olp, MD [facility] Use [email or web address] to contact on call provider

## 2020-06-26 LAB — BASIC METABOLIC PANEL
Anion gap: 11 (ref 5–15)
BUN: 15 mg/dL (ref 6–20)
CO2: 28 mmol/L (ref 22–32)
Calcium: 9.3 mg/dL (ref 8.9–10.3)
Chloride: 100 mmol/L (ref 98–111)
Creatinine, Ser: 0.96 mg/dL (ref 0.61–1.24)
GFR, Estimated: >60 mL/min (ref >60)
Glucose, Bld: 102 mg/dL — ABNORMAL HIGH (ref 70–99)
Potassium: 3.7 mmol/L (ref 3.5–5.1)
Sodium: 139 mmol/L (ref 135–145)

## 2020-06-26 LAB — CBC
HCT: 43.7 % (ref 39.0–52.0)
Hemoglobin: 14.1 g/dL (ref 13.0–17.0)
MCH: 28.5 pg (ref 26.0–34.0)
MCHC: 32.3 g/dL (ref 30.0–36.0)
MCV: 88.3 fL (ref 80.0–100.0)
Platelets: 251 10*3/uL (ref 150–400)
RBC: 4.95 MIL/uL (ref 4.22–5.81)
RDW: 13.9 % (ref 11.5–15.5)
WBC: 8 10*3/uL (ref 4.0–10.5)
nRBC: 0 % (ref 0.0–0.2)

## 2020-06-26 NOTE — Plan of Care (Signed)
Reviewed written d/c instructions w pt and all questions answered. Pt verbalized understanding. D/C ambulatory w all belongings in stable condition.

## 2020-06-30 NOTE — Discharge Summary (Signed)
Physician Discharge Summary  Patient ID: Dean Carter MRN: [id number] DOB/AGE: 03-25-1985 34 y.o.  Admit date: 06/23/2020 Discharge date: 06/26/2020 Admission Diagnoses: Recurrent diverticulitis  Discharge Diagnoses:  Active Problems:   S/P laparoscopic-assisted sigmoidectomy    Discharged Condition: good  Hospital Course: The patient was taken to the operating room on 4/20 for a robotic-assisted sigmoid colectomy. For further details of the procedure please see Dr. Lucilla Lame separately dictated operative note. Postoperatively he was admitted to the med-surg floor in stable condition. He was started on a clear liquid diet, which he tolerated with no nausea or vomiting. On POD1 he began passing flatus. Foley was removed and his diet was advanced to full liquids. On POD2 he was advanced to a soft diet, which he tolerated. On the morning of POD3 he was ambulating, having bowel function, pain was controlled, and he was tolerating oral intake without difficulty. He was examined and deemed appropriate for discharge home.   Consults: None  Significant Diagnostic Studies: N/A  Treatments: surgery: robotic-assisted sigmoid colectomy  Discharge Exam: Blood pressure (!) 131/92, pulse 75, temperature 98.7 F (37.1 C), temperature source Oral, resp. rate 16, height 6\' 1"  (1.854 m), weight 119 kg, SpO2 96 %. General appearance: alert, cooperative and appears stated age Eyes: negative Neck: trachea midline Resp: normal work of breathing GI: soft, nontender, nondistended Extremities: extremities normal, atraumatic, no cyanosis or edema Neurologic: Grossly normal Incision/Wound: incisions clean, dry and in tact  Disposition: Discharge disposition: 01-Home or Self Care       Discharge Instructions    Call MD for:  persistant nausea and vomiting   Complete by: As directed    Call MD for:  redness, tenderness, or signs of infection (pain, swelling, redness, odor or green/yellow discharge  around incision site)   Complete by: As directed    Call MD for:  severe uncontrolled pain   Complete by: As directed    Call MD for:  temperature >100.4   Complete by: As directed    Increase activity slowly   Complete by: As directed      Allergies as of 06/26/2020   No Known Allergies     Medication List    STOP taking these medications   metroNIDAZOLE 500 MG tablet Commonly known as: FLAGYL     TAKE these medications   traMADol 50 MG tablet Commonly known as: Ultram Take 1 tablet (50 mg total) by mouth every 6 (six) hours as needed for up to 5 days (postop pain not controlled with tylenol and ibuprofen first).       Follow-up Information    06/28/2020, MD Follow up in 2 week(s).   Specialties: General Surgery, Colon and Rectal Surgery Contact information: [address] [place] [id number]               Signed: [phone] 06/30/2020, 2:08 PM

## 2020-10-22 ENCOUNTER — Ambulatory Visit: Payer: Commercial Managed Care - PPO | Admitting: Medical

## 2020-10-22 ENCOUNTER — Other Ambulatory Visit: Payer: Self-pay

## 2020-10-22 VITALS — BP 120/80 | HR 80 | Temp 97.9°F | Wt 276.4 lb

## 2020-10-22 DIAGNOSIS — K921 Melena: Secondary | ICD-10-CM

## 2020-10-22 DIAGNOSIS — K649 Unspecified hemorrhoids: Secondary | ICD-10-CM

## 2020-10-22 DIAGNOSIS — K219 Gastro-esophageal reflux disease without esophagitis: Secondary | ICD-10-CM | POA: Diagnosis not present

## 2020-10-22 LAB — POCT HEMOGLOBIN: Hemoglobin: 16 g/dL — AB (ref 11–14.6)

## 2020-10-22 MED ORDER — OMEPRAZOLE 40 MG PO CPDR: 40.0000 mg | DELAYED_RELEASE_CAPSULE | Freq: Every day | ORAL | 3 refills | Status: DC

## 2020-10-22 MED ORDER — HYDROCORTISONE ACETATE 25 MG RE SUPP: 25.0000 mg | Freq: Two times a day (BID) | RECTAL | 1 refills | Status: DC

## 2020-10-22 NOTE — Patient Instructions (Signed)
Recommendations: Begin omeprazole 1 capsule daily in the morning about 45 minutes before breakfast on empty stomach.  Use the omeprazole for 2 weeks to help with acid reflux and cough You can use over-the-counter Delsym for cough as needed Began rectal suppository twice daily for 3 days.  You can repeat this periodically for blood flareup Avoid acidic and spicy foods and tomato-based foods for the time being Hydrate well with water 80 to 100 ounces per day If you do not see significant improvement over the next week or if you continue to get quite a bit of blood we may need to get you back in with gastroenterologist  Call within 2 weeks and let me know if much improved or not

## 2020-10-22 NOTE — Progress Notes (Signed)
Subjective:  Dean Carter is a 35 y.o. male who presents for Chief Complaint  Patient presents with   Blood In Stools    Blood in stool. Saturday was really bad. Acid reflux/cough- for a month     Here for blood in stool.   Had surgery for diverticulitis earlier this year, and everything has been going fine since then.  Over past week having some blood in stool.   Also having some acid reflux without heartburn,  sour taste in mouth.  Has 1.5 month lingering cough and acid reflux.   Girlfriend had covid over a month ago and he and her both have had lingering cough.  No anal itching.   No obvious hemorrhoids.  No rectal pain, no abdominal, no back pain.  Typically drinks 5 drinks of alcohol per week.   No recent NSAIDs.  Using nothing for acid reflux.     No dizziness, no lightheadedness.  No recent symptoms like prior diverticulitis symptoms.  No runny nose, no aches, no chills.  Has hx/o allergies, may be allergic to his dog, so takes allergy medication daily.   No other aggravating or relieving factors.    No other c/o.  Past Medical History:  Diagnosis Date   Allergy    RHINITIS   Diverticulitis 03/2012   few occurrences as of 05/2015   Farsightedness    wears contacts   No current outpatient medications on file prior to visit.   No current facility-administered medications on file prior to visit.    The following portions of the patient's history were reviewed and updated as appropriate: allergies, current medications, past family history, past medical history, past social history, past surgical history and problem list.  ROS Otherwise as in subjective above    Objective: BP 120/80   Pulse 80   Temp 97.9 F (36.6 C)   Wt 276 lb 6.4 oz (125.4 kg)   BMI 36.47 kg/m   Wt Readings from Last 3 Encounters:  10/22/20 276 lb 6.4 oz (125.4 kg)  06/25/20 262 lb 5.6 oz (119 kg)  06/14/20 262 lb (118.8 kg)    General appearance: alert, no distress, well developed, well  nourished HEENT: normocephalic, sclerae anicteric, conjunctiva pink and moist, TMs pearly, nares patent, no discharge or erythema, pharynx normal Oral cavity: MMM, no lesions Neck: supple, no lymphadenopathy, no thyromegaly, no masses Heart: RRR, normal S1, S2, no murmurs Lungs: CTA bilaterally, no wheezes, rhonchi, or rales Abdomen: +bs, soft, non tender, non distended, no masses, no hepatomegaly, no splenomegaly Pulses: 2+ radial pulses, 2+ pedal pulses, normal cap refill Ext: no edema Anus: 1 area of right side of anus with fullness/hemorrhoid, otherwise anus normal appearing    Assessment: Encounter Diagnoses  Name Primary?   Blood in stool Yes   Hemorrhoids, unspecified hemorrhoid type    Gastroesophageal reflux disease, unspecified whether esophagitis present      Plan: We discussed symptoms and concerns of possible causes.  I suspect internal hemorrhoids and acid reflux combining to give the symptoms of cough, reflux symptoms and blood in the stool.  We discussed the recommendations below.  Patient Instructions  Recommendations: Begin omeprazole 1 capsule daily in the morning about 45 minutes before breakfast on empty stomach.  Use the omeprazole for 2 weeks to help with acid reflux and cough You can use over-the-counter Delsym for cough as needed Began rectal suppository twice daily for 3 days.  You can repeat this periodically for blood flareup Avoid acidic and spicy  foods and tomato-based foods for the time being Hydrate well with water 80 to 100 ounces per day If you do not see significant improvement over the next week or if you continue to get quite a bit of blood we may need to get you back in with gastroenterologist  Call within 2 weeks and let me know if much improved or not   Dean Carter was seen today for blood in stools.  Diagnoses and all orders for this visit:  Blood in stool -     Hemoglobin -     Orthostatic vital signs  Hemorrhoids, unspecified  hemorrhoid type  Gastroesophageal reflux disease, unspecified whether esophagitis present  Other orders -     omeprazole (PRILOSEC) 40 MG capsule; Take 1 capsule (40 mg total) by mouth daily. -     hydrocortisone (ANUSOL-HC) 25 MG suppository; Place 1 suppository (25 mg total) rectally 2 (two) times daily.   Follow up: call report 2 weeks

## 2020-10-22 NOTE — Addendum Note (Signed)
Addended by: Jac Canavan on: 10/22/2020 12:55 PM   Modules accepted: Level of Service

## 2021-01-21 ENCOUNTER — Ambulatory Visit: Payer: Commercial Managed Care - PPO | Admitting: Medical

## 2021-01-21 ENCOUNTER — Encounter: Payer: Self-pay | Admitting: Medical

## 2021-01-21 ENCOUNTER — Other Ambulatory Visit: Payer: Self-pay

## 2021-01-21 VITALS — BP 108/82 | HR 85 | Ht 72.5 in | Wt 274.4 lb

## 2021-01-21 DIAGNOSIS — Z7289 Other problems related to lifestyle: Secondary | ICD-10-CM | POA: Insufficient documentation

## 2021-01-21 DIAGNOSIS — Z13220 Encounter for screening for lipoid disorders: Secondary | ICD-10-CM

## 2021-01-21 DIAGNOSIS — Z131 Encounter for screening for diabetes mellitus: Secondary | ICD-10-CM | POA: Diagnosis not present

## 2021-01-21 DIAGNOSIS — Z8719 Personal history of other diseases of the digestive system: Secondary | ICD-10-CM | POA: Insufficient documentation

## 2021-01-21 DIAGNOSIS — Z0000 Encounter for general adult medical examination without abnormal findings: Secondary | ICD-10-CM | POA: Diagnosis not present

## 2021-01-21 DIAGNOSIS — Z7185 Encounter for immunization safety counseling: Secondary | ICD-10-CM

## 2021-01-21 DIAGNOSIS — K760 Fatty (change of) liver, not elsewhere classified: Secondary | ICD-10-CM

## 2021-01-21 DIAGNOSIS — Z6836 Body mass index (BMI) 36.0-36.9, adult: Secondary | ICD-10-CM | POA: Insufficient documentation

## 2021-01-21 DIAGNOSIS — Z2821 Immunization not carried out because of patient refusal: Secondary | ICD-10-CM

## 2021-01-21 NOTE — Patient Instructions (Signed)
This visit was a preventative care visit, also known as wellness visit or routine physical.   Topics typically include healthy lifestyle, diet, exercise, preventative care, vaccinations, sick and well care, proper use of emergency dept and after hours care, as well as other concerns.     Recommendations: Continue to return yearly for your annual wellness and preventative care visits.  This gives [place] a chance to discuss healthy lifestyle, exercise, vaccinations, review your chart record, and perform screenings where appropriate.  I recommend you see your eye doctor yearly for routine vision care.  I recommend you see your dentist yearly for routine dental care including hygiene visits twice yearly.   Vaccination recommendations were reviewed Immunization History  Administered Date(s) Administered   Influenza,inj,Quad PF,6+ Mos 12/06/2012   Tdap 11/01/2012   You decline influenza vaccine    Screening for cancer: Colon cancer screening: You had sigmoidoscopy this past year.  Testicular cancer screening You should do a monthly self testicular exam if you are between 38-7 years old  We discussed PSA, prostate exam, and prostate cancer screening risks/benefits.     Skin cancer screening: Check your skin regularly for new changes, growing lesions, or other lesions of concern Come in for evaluation if you have skin lesions of concern.  Lung cancer screening: If you have a greater than 20 pack year history of tobacco use, then you may qualify for lung cancer screening with a chest CT scan.   Please call your insurance company to inquire about coverage for this test.  We currently don't have screenings for other cancers besides breast, cervical, colon, and lung cancers.  If you have a strong family history of cancer or have other cancer screening concerns, please let me know.    Bone health: Get at least 150 minutes of aerobic exercise weekly Get weight bearing exercise at least  once weekly Bone density test:  A bone density test is an imaging test that uses a type of X-ray to measure the amount of calcium and other minerals in your bones. The test may be used to diagnose or screen you for a condition that causes weak or thin bones (osteoporosis), predict your risk for a broken bone (fracture), or determine how well your osteoporosis treatment is working. The bone density test is recommended for females 65 and older, or females or males <65 if certain risk factors such as thyroid disease, long term use of steroids such as for asthma or rheumatological issues, vitamin D deficiency, estrogen deficiency, family history of osteoporosis, self or family history of fragility fracture in first degree relative.    Heart health: Get at least 150 minutes of aerobic exercise weekly Limit alcohol It is important to maintain a healthy blood pressure and healthy cholesterol numbers  Heart disease screening: Screening for heart disease includes screening for blood pressure, fasting lipids, glucose/diabetes screening, BMI height to weight ratio, reviewed of smoking status, physical activity, and diet.    Goals include blood pressure 120/80 or less, maintaining a healthy lipid/cholesterol profile, preventing diabetes or keeping diabetes numbers under good control, not smoking or using tobacco products, exercising most days per week or at least 150 minutes per week of exercise, and eating healthy variety of fruits and vegetables, healthy oils, and avoiding unhealthy food choices like fried food, fast food, high sugar and high cholesterol foods.    Other tests may possibly include EKG test, CT coronary calcium score, echocardiogram, exercise treadmill stress test.    Medical care options: I recommend  you continue to seek care here first for routine care.  We try really hard to have available appointments Monday through Friday daytime hours for sick visits, acute visits, and physicals.   Urgent care should be used for after hours and weekends for significant issues that cannot wait till the next day.  The emergency department should be used for significant potentially life-threatening emergencies.  The emergency department is expensive, can often have long wait times for less significant concerns, so try to utilize primary care, urgent care, or telemedicine when possible to avoid unnecessary trips to the emergency department.  Virtual visits and telemedicine have been introduced since the pandemic started in 2020, and can be convenient ways to receive medical care.  We offer virtual appointments as well to assist you in a variety of options to seek medical care.   Separate significant issues discussed: Radiologist recommended repeat CT abdomen pelvis after your prior scan a year ago.  If you would like to do this let me know.  This was to help confirm that the lymph nodes have resolved from being inflamed  BMI greater than 30-work on efforts to lose weight through healthy diet and exercise.  Hepatic steatosis/fatty liver - We discussed the likely diagnosis of fatty liver disease based on mildly elevated liver enzymes and ultrasound results although biopsy is needed to confirm the diagnosis.  Discussed possible causes, possible complications and that symptoms may not show up for years.   We will hold off on referral to GI at this time.  We discussed treatment recommendations including weight loss, healthy diet recommendations, regular aerobic exercise, limiting alcohol.  We will monitor liver test from time to time.   History of diverticulitis and prior surgery-doing fine currently  Vaping-I recommend you stop all forms of tobacco

## 2021-01-21 NOTE — Progress Notes (Signed)
Subjective:   HPI  Dean Carter is a 35 y.o. male who presents for Chief Complaint  Patient presents with   Annual Exam    Patient Care Team: Hy Swiatek, Cleda Mccreedy as PCP - General (Family Medicine) Sees dentist Sees eye doctor  Concerns: Doing fine.  He had surgery earlier this year for diverticulitis.  Ever since then he has been doing well.  He had Lasik surgery this year as well.  Reviewed their medical, surgical, family, social, medication, and allergy history and updated chart as appropriate.  Past Medical History:  Diagnosis Date   Allergy    RHINITIS   Diverticulitis 03/2012   few occurrences as of 05/2015   Farsightedness    wears contacts    Past Surgical History:  Procedure Laterality Date   APPENDECTOMY     COLON SURGERY  06/2020   diverticulitis   FLEXIBLE SIGMOIDOSCOPY N/A 06/23/2020   Procedure: FLEXIBLE SIGMOIDOSCOPY;  Surgeon: Andria Meuse, MD;  Location: [facility];  Service: General;  Laterality: N/A;   LASIK  2022   WISDOM TOOTH EXTRACTION      Family History  Problem Relation Age of Onset   Diabetes Mother        TYPE 2   Arthritis Father        TKR   Heart disease Paternal Uncle        CABG   Stroke Maternal Grandmother    Heart disease Paternal Grandfather        CABG   Cancer Neg Hx        no colon or esophagus    Hypertension Neg Hx    Hyperlipidemia Neg Hx      Current Outpatient Medications:    cetirizine (ZYRTEC) 10 MG tablet, Take 10 mg by mouth daily., Disp: , Rfl:   Allergies  Allergen Reactions   Dog Epithelium     itching     Review of Systems Constitutional: -fever, -chills, -sweats, -unexpected weight change, -decreased appetite, -fatigue Allergy: -sneezing, -itching, -congestion Dermatology: -changing moles, --rash, -lumps ENT: -runny nose, -ear pain, -sore throat, -hoarseness, -sinus pain, -teeth pain, - ringing in ears, -hearing loss, -nosebleeds Cardiology: -chest pain, -palpitations, -swelling,  -difficulty breathing when lying flat, -waking up short of breath Respiratory: -cough, -shortness of breath, -difficulty breathing with exercise or exertion, -wheezing, -coughing up blood Gastroenterology: -abdominal pain, -nausea, -vomiting, -diarrhea, -constipation, -blood in stool, -changes in bowel movement, -difficulty swallowing or eating Hematology: -bleeding, -bruising  Musculoskeletal: -joint aches, -muscle aches, -joint swelling, -back pain, -neck pain, -cramping, -changes in gait Ophthalmology: denies vision changes, eye redness, itching, discharge Urology: -burning with urination, -difficulty urinating, -blood in urine, -urinary frequency, -urgency, -incontinence Neurology: -headache, -weakness, -tingling, -numbness, -memory loss, -falls, -dizziness Psychology: -depressed mood, -agitation, -sleep problems Male GU: no testicular mass, pain, no lymph nodes swollen, no swelling, no rash.  Depression screen [facility] 2/9 01/21/2021 10/22/2020 03/22/2018 09/29/2016  Decreased Interest 0 0 0 0  Down, Depressed, Hopeless 0 0 0 0  PHQ - 2 Score 0 0 0 0        Objective:  BP 108/82 (BP Location: Right Arm, Patient Position: Sitting)   Pulse 85   Ht 6' 0.5" (1.842 m)   Wt 274 lb 6.4 oz (124.5 kg)   SpO2 95%   BMI 36.70 kg/m   General appearance: alert, no distress, WD/WN, Caucasian male Skin: unremarkable HEENT: normocephalic, conjunctiva/corneas normal, sclerae anicteric, PERRLA, EOMi Neck: supple, no lymphadenopathy, no thyromegaly, no masses, normal  ROM, no bruits Chest: non tender, normal shape and expansion Heart: RRR, normal S1, S2, no murmurs Lungs: CTA bilaterally, no wheezes, rhonchi, or rales Abdomen: +bs, soft, non tender, non distended, no masses, no hepatomegaly, no splenomegaly, no bruits Back: non tender, normal ROM, no scoliosis Musculoskeletal: upper extremities non tender, no obvious deformity, normal ROM throughout, lower extremities non tender, no obvious deformity,  normal ROM throughout Extremities: no edema, no cyanosis, no clubbing Pulses: 2+ symmetric, upper and lower extremities, normal cap refill Neurological: alert, oriented x 3, CN2-12 intact, strength normal upper extremities and lower extremities, sensation normal throughout, DTRs 2+ throughout, no cerebellar signs, gait normal Psychiatric: normal affect, behavior normal, pleasant  GU: normal male external genitalia,circumcised, nontender, no masses, no hernia, no lymphadenopathy Rectal: deferred   Assessment and Plan :   Encounter Diagnoses  Name Primary?   Encounter for health maintenance examination in adult Yes   Screening for lipid disorders    Screening for diabetes mellitus    Hepatic steatosis    BMI 36.0-36.9,adult    History of diverticulitis    Vaccine counseling    Influenza vaccination declined    Engages in vaping     This visit was a preventative care visit, also known as wellness visit or routine physical.   Topics typically include healthy lifestyle, diet, exercise, preventative care, vaccinations, sick and well care, proper use of emergency dept and after hours care, as well as other concerns.     Recommendations: Continue to return yearly for your annual wellness and preventative care visits.  This gives [place] a chance to discuss healthy lifestyle, exercise, vaccinations, review your chart record, and perform screenings where appropriate.  I recommend you see your eye doctor yearly for routine vision care.  I recommend you see your dentist yearly for routine dental care including hygiene visits twice yearly.   Vaccination recommendations were reviewed Immunization History  Administered Date(s) Administered   Influenza,inj,Quad PF,6+ Mos 12/06/2012   Tdap 11/01/2012   You decline influenza vaccine    Screening for cancer: Colon cancer screening: You had sigmoidoscopy this past year.  Testicular cancer screening You should do a monthly self testicular  exam if you are between 11-36 years old  We discussed PSA, prostate exam, and prostate cancer screening risks/benefits.     Skin cancer screening: Check your skin regularly for new changes, growing lesions, or other lesions of concern Come in for evaluation if you have skin lesions of concern.  Lung cancer screening: If you have a greater than 20 pack year history of tobacco use, then you may qualify for lung cancer screening with a chest CT scan.   Please call your insurance company to inquire about coverage for this test.  We currently don't have screenings for other cancers besides breast, cervical, colon, and lung cancers.  If you have a strong family history of cancer or have other cancer screening concerns, please let me know.    Bone health: Get at least 150 minutes of aerobic exercise weekly Get weight bearing exercise at least once weekly Bone density test:  A bone density test is an imaging test that uses a type of X-ray to measure the amount of calcium and other minerals in your bones. The test may be used to diagnose or screen you for a condition that causes weak or thin bones (osteoporosis), predict your risk for a broken bone (fracture), or determine how well your osteoporosis treatment is working. The bone density test is recommended for  females 40 and older, or females or males <65 if certain risk factors such as thyroid disease, long term use of steroids such as for asthma or rheumatological issues, vitamin D deficiency, estrogen deficiency, family history of osteoporosis, self or family history of fragility fracture in first degree relative.    Heart health: Get at least 150 minutes of aerobic exercise weekly Limit alcohol It is important to maintain a healthy blood pressure and healthy cholesterol numbers  Heart disease screening: Screening for heart disease includes screening for blood pressure, fasting lipids, glucose/diabetes screening, BMI height to weight ratio,  reviewed of smoking status, physical activity, and diet.    Goals include blood pressure 120/80 or less, maintaining a healthy lipid/cholesterol profile, preventing diabetes or keeping diabetes numbers under good control, not smoking or using tobacco products, exercising most days per week or at least 150 minutes per week of exercise, and eating healthy variety of fruits and vegetables, healthy oils, and avoiding unhealthy food choices like fried food, fast food, high sugar and high cholesterol foods.    Other tests may possibly include EKG test, CT coronary calcium score, echocardiogram, exercise treadmill stress test.    Medical care options: I recommend you continue to seek care here first for routine care.  We try really hard to have available appointments Monday through Friday daytime hours for sick visits, acute visits, and physicals.  Urgent care should be used for after hours and weekends for significant issues that cannot wait till the next day.  The emergency department should be used for significant potentially life-threatening emergencies.  The emergency department is expensive, can often have long wait times for less significant concerns, so try to utilize primary care, urgent care, or telemedicine when possible to avoid unnecessary trips to the emergency department.  Virtual visits and telemedicine have been introduced since the pandemic started in 2020, and can be convenient ways to receive medical care.  We offer virtual appointments as well to assist you in a variety of options to seek medical care.   Separate significant issues discussed: Radiologist recommended repeat CT abdomen pelvis after your prior scan a year ago.  If you would like to do this let me know.  This was to help confirm that the lymph nodes have resolved from being inflamed  BMI greater than 30-work on efforts to lose weight through healthy diet and exercise.  Hepatic steatosis/fatty liver - We discussed the likely  diagnosis of fatty liver disease based on mildly elevated liver enzymes and ultrasound results although biopsy is needed to confirm the diagnosis.  Discussed possible causes, possible complications and that symptoms may not show up for years.   We will hold off on referral to GI at this time.  We discussed treatment recommendations including weight loss, healthy diet recommendations, regular aerobic exercise, limiting alcohol.  We will monitor liver test from time to time.   History of diverticulitis and prior surgery-doing fine currently  Vaping-I recommend you stop all forms of tobacco   Alva was seen today for annual exam.  Diagnoses and all orders for this visit:  Encounter for health maintenance examination in adult -     Comprehensive metabolic panel -     CBC -     Lipid panel -     Urinalysis, Routine w reflex microscopic -     Hemoglobin A1c  Screening for lipid disorders -     Lipid panel  Screening for diabetes mellitus -     Hemoglobin A1c  Hepatic steatosis  BMI 36.0-36.9,adult  History of diverticulitis  Vaccine counseling  Influenza vaccination declined  Engages in vaping  Follow-up pending labs, yearly for physical

## 2021-01-22 LAB — COMPREHENSIVE METABOLIC PANEL
ALT: 42 IU/L (ref 0–44)
AST: 21 IU/L (ref 0–40)
Albumin/Globulin Ratio: 2.4 — ABNORMAL HIGH (ref 1.2–2.2)
Albumin: 4.8 g/dL (ref 4.0–5.0)
Alkaline Phosphatase: 48 IU/L (ref 44–121)
BUN/Creatinine Ratio: 9 (ref 9–20)
BUN: 10 mg/dL (ref 6–20)
Bilirubin Total: 0.8 mg/dL (ref 0.0–1.2)
CO2: 25 mmol/L (ref 20–29)
Calcium: 9.6 mg/dL (ref 8.7–10.2)
Chloride: 102 mmol/L (ref 96–106)
Creatinine, Ser: 1.14 mg/dL (ref 0.76–1.27)
Globulin, Total: 2 g/dL (ref 1.5–4.5)
Glucose: 100 mg/dL — ABNORMAL HIGH (ref 70–99)
Potassium: 4.7 mmol/L (ref 3.5–5.2)
Sodium: 141 mmol/L (ref 134–144)
Total Protein: 6.8 g/dL (ref 6.0–8.5)
eGFR: 85 mL/min/{1.73_m2} (ref >59)

## 2021-01-22 LAB — HEMOGLOBIN A1C
Est. average glucose Bld gHb Est-mCnc: 120 mg/dL
Hgb A1c MFr Bld: 5.8 % — ABNORMAL HIGH (ref 4.8–5.6)

## 2021-01-22 LAB — LIPID PANEL
Chol/HDL Ratio: 4.7 ratio (ref 0.0–5.0)
Cholesterol, Total: 174 mg/dL (ref 100–199)
HDL: 37 mg/dL — ABNORMAL LOW (ref >39)
LDL Chol Calc (NIH): 116 mg/dL — ABNORMAL HIGH (ref 0–99)
Triglycerides: 114 mg/dL (ref 0–149)
VLDL Cholesterol Cal: 21 mg/dL (ref 5–40)

## 2021-01-22 LAB — URINALYSIS, ROUTINE W REFLEX MICROSCOPIC
Bilirubin, UA: NEGATIVE
Glucose, UA: NEGATIVE
Ketones, UA: NEGATIVE
Leukocytes,UA: NEGATIVE
Nitrite, UA: NEGATIVE
Protein,UA: NEGATIVE
RBC, UA: NEGATIVE
Specific Gravity, UA: 1.023 (ref 1.005–1.030)
Urobilinogen, Ur: 1 mg/dL (ref 0.2–1.0)
pH, UA: 6 (ref 5.0–7.5)

## 2021-01-22 LAB — CBC
Hematocrit: 46.3 % (ref 37.5–51.0)
Hemoglobin: 15.8 g/dL (ref 13.0–17.7)
MCH: 28.8 pg (ref 26.6–33.0)
MCHC: 34.1 g/dL (ref 31.5–35.7)
MCV: 85 fL (ref 79–97)
Platelets: 286 10*3/uL (ref 150–450)
RBC: 5.48 x10E6/uL (ref 4.14–5.80)
RDW: 12.2 % (ref 11.6–15.4)
WBC: 6.8 10*3/uL (ref 3.4–10.8)

## 2021-03-13 IMAGING — CT CT ABD-PELV W/ CM
2 of 4 series · 15 of 46 positions shown, 17 images · IV contrast (omnipaque)
Comparison: None.

CLINICAL DATA: Left lower quadrant pain since [REDACTED].

EXAM:
CT ABDOMEN AND PELVIS WITH CONTRAST
TECHNIQUE: Multidetector CT imaging of the abdomen and pelvis was performed
using the standard protocol following bolus administration of
intravenous contrast.
CONTRAST:  100mL OMNIPAQUE IOHEXOL 300 MG/ML  SOLN

[Series 2: axial st · axial · 0.98mm/px · z∈[-651,-96]mm · 12 of 128 slices shown, 14 images]
[im 11/128  soft-tissue]
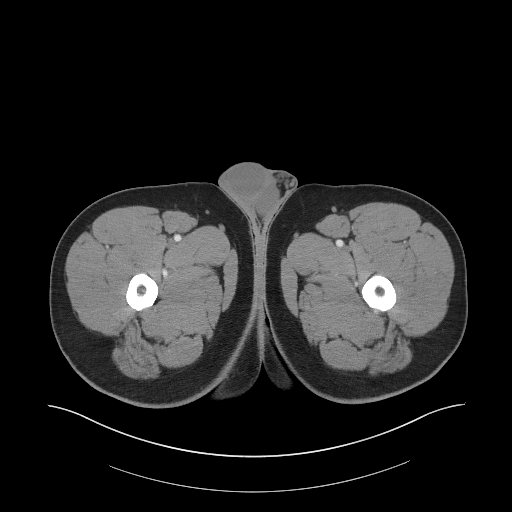
[im 11/128  bone]
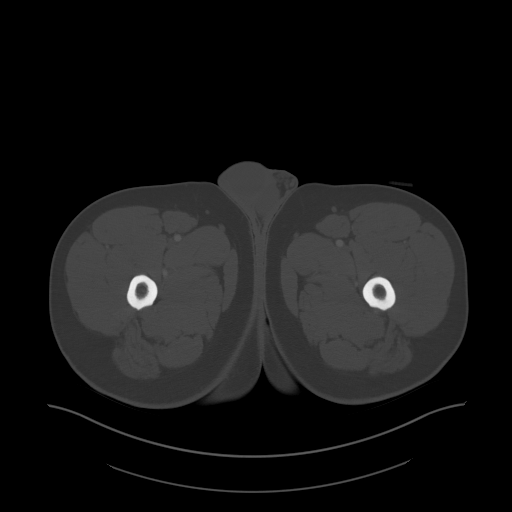
[im 21/128  soft-tissue]
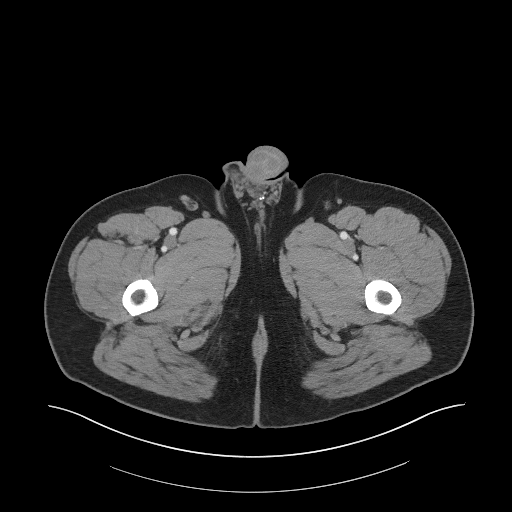
[im 31/128  soft-tissue]
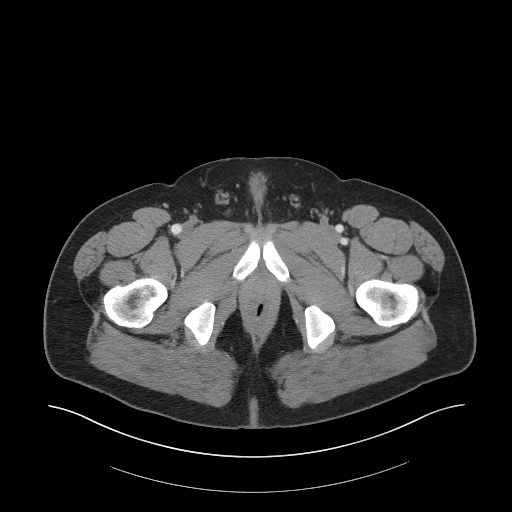
[im 41/128  soft-tissue]
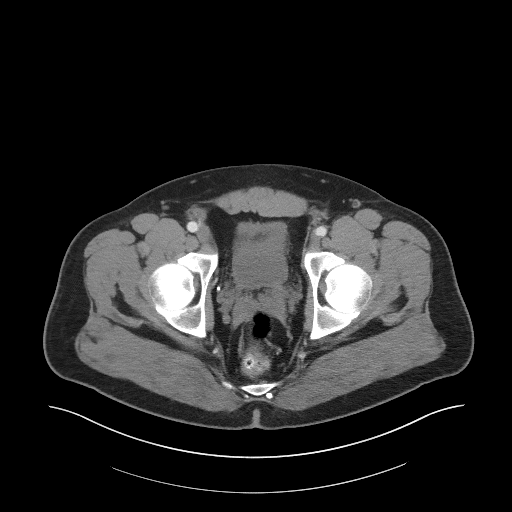
[im 51/128  soft-tissue]
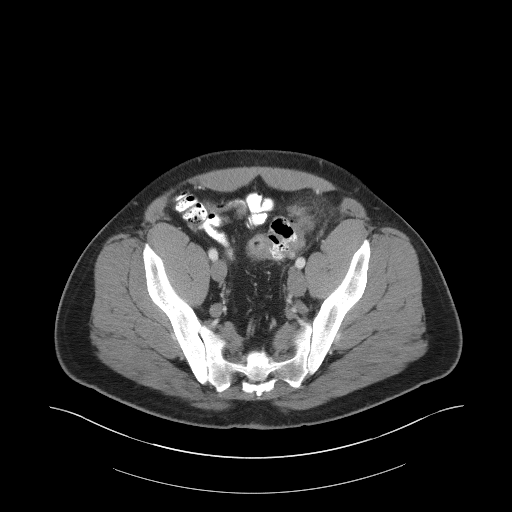
[im 61/128  soft-tissue]
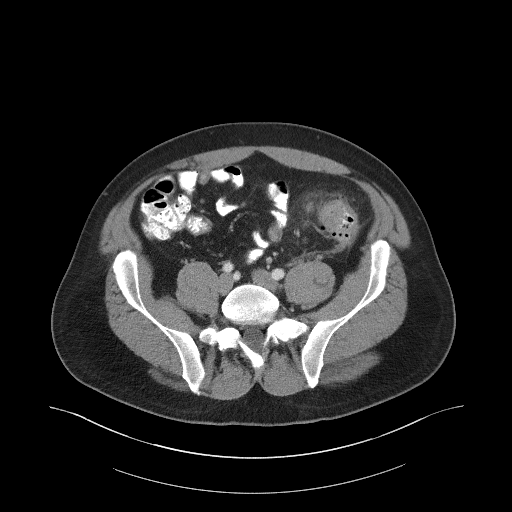
[im 72/128  soft-tissue]
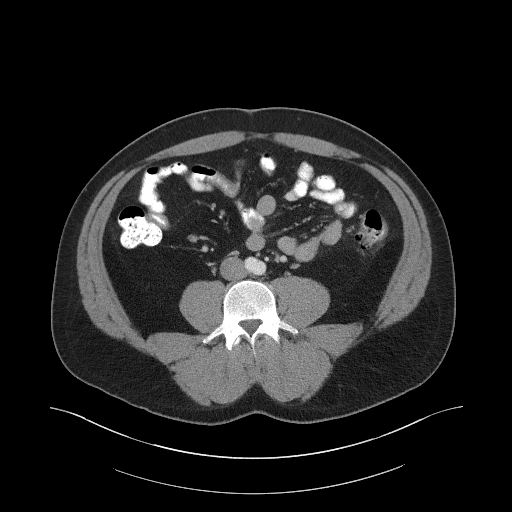
[im 82/128  soft-tissue]
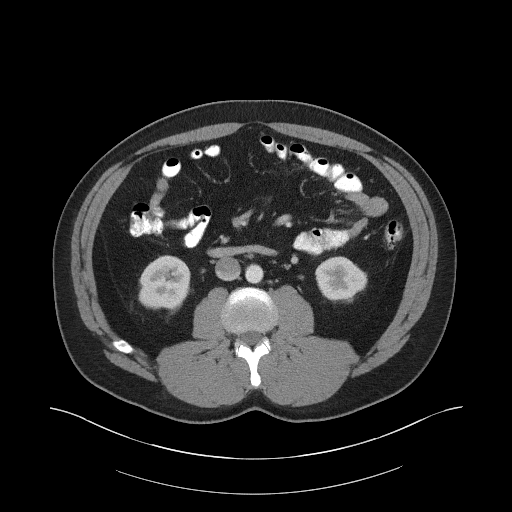
[im 92/128  soft-tissue]
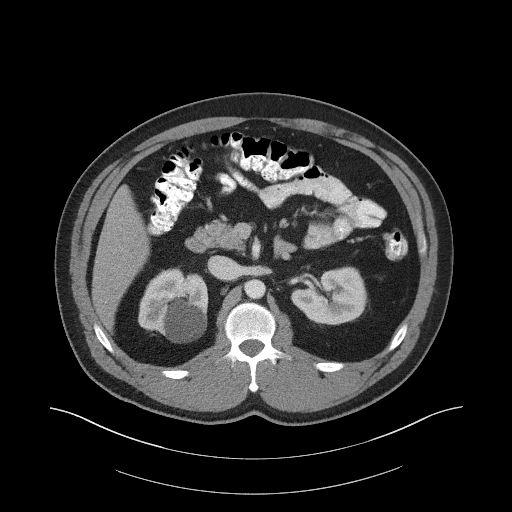
[im 92/128  bone]
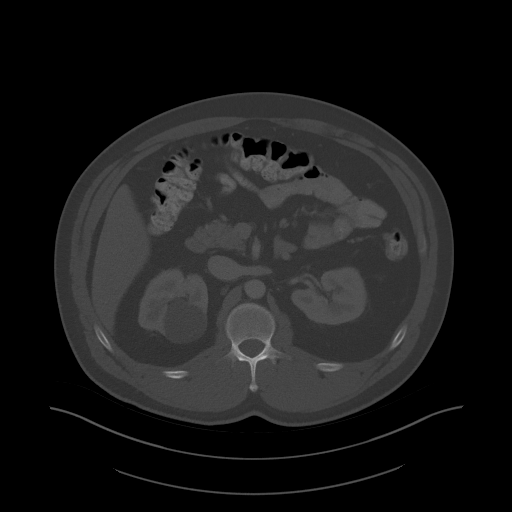
[im 102/128  soft-tissue]
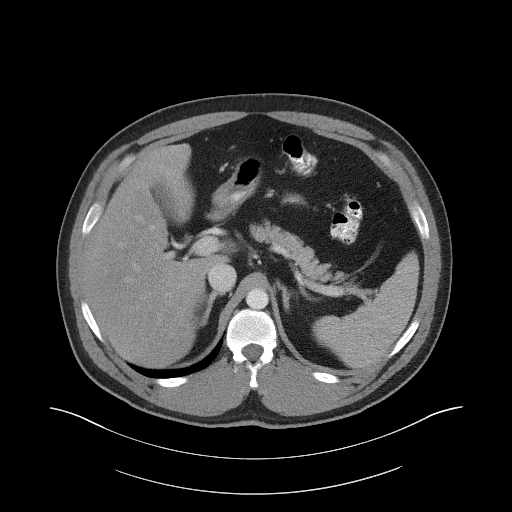
[im 112/128  soft-tissue]
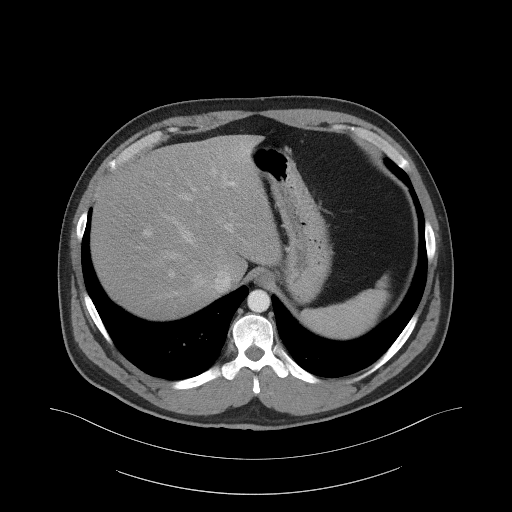
[im 122/128  soft-tissue]
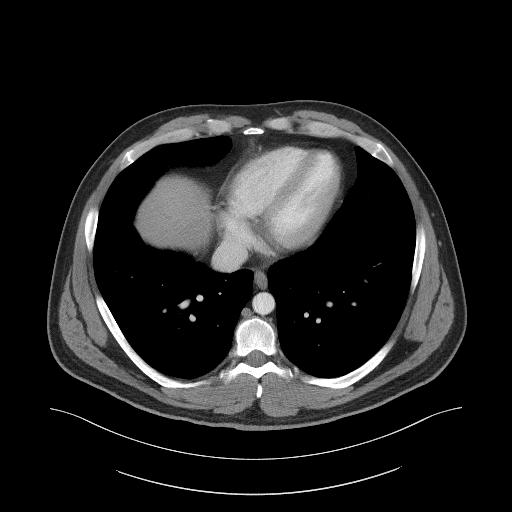

[Series 5: coronal st · coronal · 0.86mm/px · 3 of 109 slices shown]
[im 37/109  soft-tissue]
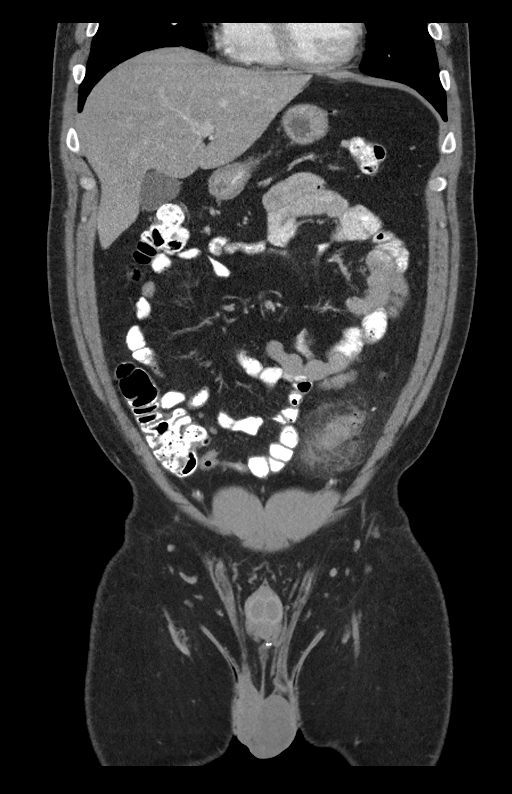
[im 49/109  soft-tissue]
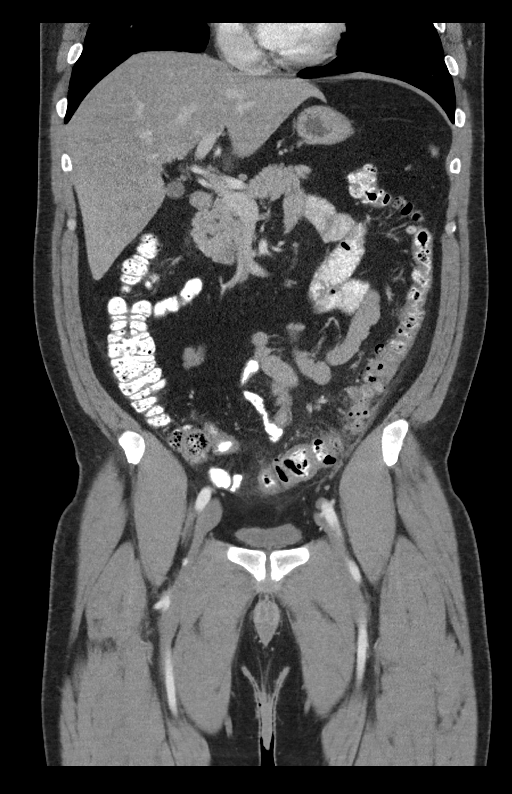
[im 61/109  soft-tissue]
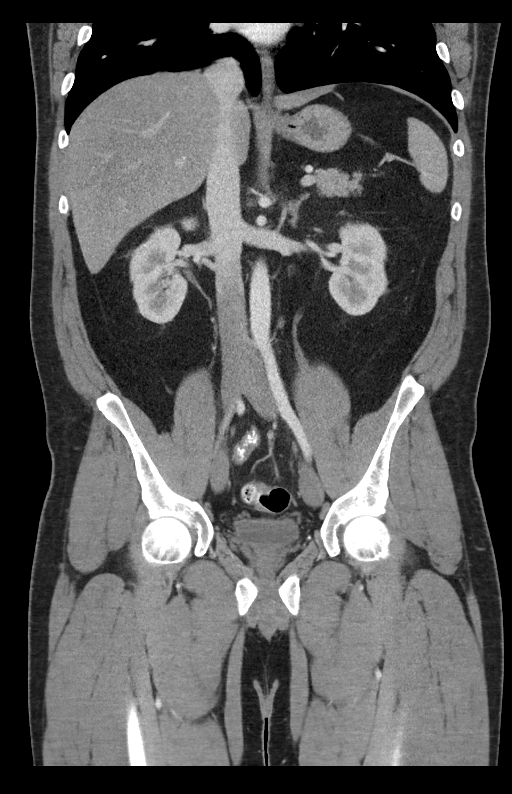

[15 of 46 positions shown; findings below may reference images not displayed]

FINDINGS: Lower chest: Lung bases are clear. Heart size normal. No pericardial
or pleural effusion. Distal esophagus is unremarkable.

Hepatobiliary: Liver is decreased in attenuation diffusely. Liver
and gallbladder are otherwise unremarkable. No biliary ductal
dilatation.

Pancreas: Negative.

Spleen: Negative.

Adrenals/Urinary Tract: Adrenal glands are unremarkable. 4.7 cm
low-attenuation lesion in the right kidney is indicative of a cyst.
Kidneys are otherwise unremarkable. Ureters are decompressed.
Bladder is low in volume.

Stomach/Bowel: Stomach and small bowel are unremarkable. Appendix is
not readily visualized. Majority of the colon is unremarkable. There
is wall thickening and pericolonic inflammatory haziness/stranding
involving the distal descending/proximal sigmoid colon. Pericolonic
lymph nodes measure up to 8 mm (2/67). No extraluminal air or
organized fluid.

Vascular/Lymphatic: Vascular structures are unremarkable. Left
common iliac lymph nodes measure up to 13 mm (2/63). Ileocolic
mesenteric lymph nodes measure up to 9 mm.

Reproductive: Prostate is visualized.

Other: No free fluid. Mesenteries and peritoneum are otherwise
unremarkable.

Musculoskeletal: None.
IMPRESSION: 1. Sigmoid diverticulitis without complicating feature. The
possibility of underlying malignancy is considered given significant
wall thickening and adenopathy extending to the left common iliac
chain. Follow-up CT abdomen pelvis with contrast is suggested after
appropriate clinical therapy, as clinically indicated. These results
will be called to the ordering clinician or representative by the
Radiologist Assistant, and communication documented in the PACS or
[REDACTED].
2. Hepatic steatosis.

## 2021-07-25 ENCOUNTER — Encounter: Payer: Self-pay | Admitting: *Deleted

## 2021-11-09 ENCOUNTER — Encounter: Payer: Self-pay | Admitting: Internal Medicine

## 2021-12-13 ENCOUNTER — Encounter: Payer: Self-pay | Admitting: Internal Medicine

## 2022-02-03 ENCOUNTER — Ambulatory Visit: Payer: Commercial Managed Care - PPO | Admitting: Medical

## 2022-02-03 ENCOUNTER — Encounter: Payer: Self-pay | Admitting: Medical

## 2022-02-03 VITALS — BP 116/80 | HR 79 | Ht 73.0 in | Wt 283.0 lb

## 2022-02-03 DIAGNOSIS — Z13220 Encounter for screening for lipoid disorders: Secondary | ICD-10-CM

## 2022-02-03 DIAGNOSIS — Z7185 Encounter for immunization safety counseling: Secondary | ICD-10-CM

## 2022-02-03 DIAGNOSIS — R748 Abnormal levels of other serum enzymes: Secondary | ICD-10-CM | POA: Diagnosis not present

## 2022-02-03 DIAGNOSIS — K760 Fatty (change of) liver, not elsewhere classified: Secondary | ICD-10-CM

## 2022-02-03 DIAGNOSIS — Z8719 Personal history of other diseases of the digestive system: Secondary | ICD-10-CM | POA: Diagnosis not present

## 2022-02-03 DIAGNOSIS — Z9049 Acquired absence of other specified parts of digestive tract: Secondary | ICD-10-CM

## 2022-02-03 DIAGNOSIS — Z6837 Body mass index (BMI) 37.0-37.9, adult: Secondary | ICD-10-CM

## 2022-02-03 DIAGNOSIS — R7301 Impaired fasting glucose: Secondary | ICD-10-CM

## 2022-02-03 DIAGNOSIS — Z0000 Encounter for general adult medical examination without abnormal findings: Secondary | ICD-10-CM

## 2022-02-03 DIAGNOSIS — Z131 Encounter for screening for diabetes mellitus: Secondary | ICD-10-CM

## 2022-02-03 DIAGNOSIS — Z2821 Immunization not carried out because of patient refusal: Secondary | ICD-10-CM

## 2022-02-03 DIAGNOSIS — H0520 Unspecified exophthalmos: Secondary | ICD-10-CM

## 2022-02-03 LAB — POCT URINALYSIS DIP (PROADVANTAGE DEVICE)
Bilirubin, UA: NEGATIVE
Blood, UA: NEGATIVE
Glucose, UA: NEGATIVE mg/dL
Ketones, POC UA: NEGATIVE mg/dL
Leukocytes, UA: NEGATIVE
Nitrite, UA: NEGATIVE
Protein Ur, POC: NEGATIVE mg/dL
Specific Gravity, Urine: 1.025
Urobilinogen, Ur: NEGATIVE
pH, UA: 6 (ref 5.0–8.0)

## 2022-02-03 NOTE — Patient Instructions (Signed)
This visit was a preventative care visit, also known as wellness visit or routine physical.   Topics typically include healthy lifestyle, diet, exercise, preventative care, vaccinations, sick and well care, proper use of emergency dept and after hours care, as well as other concerns.     Recommendations: Continue to return yearly for your annual wellness and preventative care visits.  This gives [place] a chance to discuss healthy lifestyle, exercise, vaccinations, review your chart record, and perform screenings where appropriate.  I recommend you see your eye doctor yearly for routine vision care.  I recommend you see your dentist yearly for routine dental care including hygiene visits twice yearly.   Vaccination recommendations were reviewed Immunization History  Administered Date(s) Administered   Influenza,inj,Quad PF,6+ Mos 12/06/2012   Moderna Sars-Covid-2 Vaccination 05/22/2019, 06/19/2019   Tdap 11/01/2012   You decline influenza vaccine    Screening for cancer: Colon cancer screening: Up to date per GI  Testicular cancer screening You should do a monthly self testicular exam if you are between 66-68 years old  We discussed PSA, prostate exam, and prostate cancer screening risks/benefits.   Age 36  Skin cancer screening: Check your skin regularly for new changes, growing lesions, or other lesions of concern Come in for evaluation if you have skin lesions of concern.  Lung cancer screening: If you have a greater than 20 pack year history of tobacco use, then you may qualify for lung cancer screening with a chest CT scan.   Please call your insurance company to inquire about coverage for this test.  We currently don't have screenings for other cancers besides breast, cervical, colon, and lung cancers.  If you have a strong family history of cancer or have other cancer screening concerns, please let me know.    Bone health: Get at least 150 minutes of aerobic exercise  weekly Get weight bearing exercise at least once weekly Bone density test:  A bone density test is an imaging test that uses a type of X-ray to measure the amount of calcium and other minerals in your bones. The test may be used to diagnose or screen you for a condition that causes weak or thin bones (osteoporosis), predict your risk for a broken bone (fracture), or determine how well your osteoporosis treatment is working. The bone density test is recommended for females 65 and older, or females or males <65 if certain risk factors such as thyroid disease, long term use of steroids such as for asthma or rheumatological issues, vitamin D deficiency, estrogen deficiency, family history of osteoporosis, self or family history of fragility fracture in first degree relative.    Heart health: Get at least 150 minutes of aerobic exercise weekly Limit alcohol It is important to maintain a healthy blood pressure and healthy cholesterol numbers  Heart disease screening: Screening for heart disease includes screening for blood pressure, fasting lipids, glucose/diabetes screening, BMI height to weight ratio, reviewed of smoking status, physical activity, and diet.    Goals include blood pressure 120/80 or less, maintaining a healthy lipid/cholesterol profile, preventing diabetes or keeping diabetes numbers under good control, not smoking or using tobacco products, exercising most days per week or at least 150 minutes per week of exercise, and eating healthy variety of fruits and vegetables, healthy oils, and avoiding unhealthy food choices like fried food, fast food, high sugar and high cholesterol foods.    Other tests may possibly include EKG test, CT coronary calcium score, echocardiogram, exercise treadmill stress test.  Medical care options: I recommend you continue to seek care here first for routine care.  We try really hard to have available appointments Monday through Friday daytime hours for  sick visits, acute visits, and physicals.  Urgent care should be used for after hours and weekends for significant issues that cannot wait till the next day.  The emergency department should be used for significant potentially life-threatening emergencies.  The emergency department is expensive, can often have long wait times for less significant concerns, so try to utilize primary care, urgent care, or telemedicine when possible to avoid unnecessary trips to the emergency department.  Virtual visits and telemedicine have been introduced since the pandemic started in 2020, and can be convenient ways to receive medical care.  We offer virtual appointments as well to assist you in a variety of options to seek medical care.   Separate significant issues discussed: BMI greater than 30-work on efforts to lose weight through healthy diet and exercise.  History of Hepatic steatosis/fatty liver - work on efforts to eat less sugar, less carbs, and losing weight  History of diverticulitis and prior surgery-doing fine currently  Use to vape but now does nicotine pouches.    Prior impaired glucose - recheck labs today

## 2022-02-03 NOTE — Progress Notes (Signed)
Subjective:   HPI  Dean Carter is a 36 y.o. male who presents for Chief Complaint  Patient presents with   fasting cpe    Fasitng cpe, no concerns. Declines flu and covid shots    Patient Care Team: Addie Alonge, Cleda Mccreedy as PCP - General (Family Medicine) Sees dentist Sees eye doctor Dr. Marin Olp, general surgery Dr. 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    Fasting.  No recent issues. Has done well since colon surgery last year.  Reviewed their medical, surgical, family, social, medication, and allergy history and updated chart as appropriate.  Past Medical History:  Diagnosis Date   Allergy    RHINITIS   Diverticulitis 03/2012   few occurrences as of 05/2015   Farsightedness    wears contacts    Past Surgical History:  Procedure Laterality Date   APPENDECTOMY     COLON SURGERY  06/2020   diverticulitis   FLEXIBLE SIGMOIDOSCOPY N/A 06/23/2020   Procedure: FLEXIBLE SIGMOIDOSCOPY;  Surgeon: Andria Meuse, MD;  Location: [facility];  Service: General;  Laterality: N/A;   LASIK  2022   WISDOM TOOTH EXTRACTION      Family History  Problem Relation Age of Onset   Diabetes Mother        TYPE 2   Arthritis Father        TKR   Heart disease Paternal Uncle        CABG   Stroke Maternal Grandmother    Heart disease Paternal Grandfather        CABG   Cancer Neg Hx        no colon or esophagus    Hypertension Neg Hx    Hyperlipidemia Neg Hx     No current outpatient medications on file.  Allergies  Allergen Reactions   Dog Epithelium     itching    Review of Systems  Constitutional:  Negative for chills, fever, malaise/fatigue and weight loss.  HENT:  Negative for congestion, ear pain, hearing loss, sore throat and tinnitus.   Eyes:  Negative for blurred vision, pain and redness.  Respiratory:  Negative for cough, hemoptysis and shortness of breath.   Cardiovascular:  Negative for  chest pain, palpitations, orthopnea, claudication and leg swelling.  Gastrointestinal:  Negative for abdominal pain, blood in stool, constipation, diarrhea, nausea and vomiting.  Genitourinary:  Negative for dysuria, flank pain, frequency, hematuria and urgency.  Musculoskeletal:  Negative for falls, joint pain and myalgias.  Skin:  Negative for itching and rash.  Neurological:  Negative for dizziness, tingling, speech change, weakness and headaches.  Endo/Heme/Allergies:  Negative for polydipsia. Does not bruise/bleed easily.  Psychiatric/Behavioral:  Negative for depression and memory loss. The patient is not nervous/anxious and does not have insomnia.         02/03/2022    8:16 AM 01/21/2021    8:29 AM 10/22/2020    8:35 AM 03/22/2018    9:37 AM 09/29/2016    8:20 AM  Depression screen PHQ 2/9  Decreased Interest 0 0 0 0 0  Down, Depressed, Hopeless 0 0 0 0 0  PHQ - 2 Score 0 0 0 0 0        Objective:  BP 116/80   Pulse 79   Ht 6\' 1"  (1.854 m)   Wt 283 lb (128.4 kg)   BMI 37.34 kg/m   Wt Readings from Last 3 Encounters:  02/03/22 283  lb (128.4 kg)  01/21/21 274 lb 6.4 oz (124.5 kg)  10/22/20 276 lb 6.4 oz (125.4 kg)   BP Readings from Last 3 Encounters:  02/03/22 116/80  01/21/21 108/82  10/22/20 120/80    General appearance: alert, no distress, WD/WN, Caucasian male Skin: unremarkable HEENT: normocephalic, conjunctiva/corneas normal, sclerae anicteric, PERRLA, EOMi Neck: supple, no lymphadenopathy, no thyromegaly, no masses, normal ROM, no bruits Chest: non tender, normal shape and expansion Heart: RRR, normal S1, S2, no murmurs Lungs: CTA bilaterally, no wheezes, rhonchi, or rales Abdomen: +bs, soft, non tender, non distended, no masses, no hepatomegaly, no splenomegaly, no bruits Back: non tender, normal ROM, no scoliosis Musculoskeletal: upper extremities non tender, no obvious deformity, normal ROM throughout, lower extremities non tender, no obvious  deformity, normal ROM throughout Extremities: no edema, no cyanosis, no clubbing Pulses: 2+ symmetric, upper and lower extremities, normal cap refill Neurological: alert, oriented x 3, CN2-12 intact, strength normal upper extremities and lower extremities, sensation normal throughout, DTRs 2+ throughout, no cerebellar signs, gait normal Psychiatric: normal affect, behavior normal, pleasant  GU: normal male external genitalia,circumcised, nontender, no masses, no hernia, no lymphadenopathy Rectal: deferred   Assessment and Plan :   Encounter Diagnoses  Name Primary?   Encounter for health maintenance examination in adult Yes   History of diverticulitis    Low serum HDL    Vaccine counseling    Screening for lipid disorders    Screening for diabetes mellitus    Impaired fasting blood sugar    Hepatic steatosis    BMI 37.0-37.9, adult    S/P laparoscopic-assisted sigmoidectomy    Exophthalmos    Influenza vaccination declined     This visit was a preventative care visit, also known as wellness visit or routine physical.   Topics typically include healthy lifestyle, diet, exercise, preventative care, vaccinations, sick and well care, proper use of emergency dept and after hours care, as well as other concerns.     Recommendations: Continue to return yearly for your annual wellness and preventative care visits.  This gives [place] a chance to discuss healthy lifestyle, exercise, vaccinations, review your chart record, and perform screenings where appropriate.  I recommend you see your eye doctor yearly for routine vision care.  I recommend you see your dentist yearly for routine dental care including hygiene visits twice yearly.   Vaccination recommendations were reviewed Immunization History  Administered Date(s) Administered   Influenza,inj,Quad PF,6+ Mos 12/06/2012   Moderna Sars-Covid-2 Vaccination 05/22/2019, 06/19/2019   Tdap 11/01/2012   You decline influenza  vaccine    Screening for cancer: Colon cancer screening: Up to date per GI  Testicular cancer screening You should do a monthly self testicular exam if you are between 52-40 years old  We discussed PSA, prostate exam, and prostate cancer screening risks/benefits.   Age 37  Skin cancer screening: Check your skin regularly for new changes, growing lesions, or other lesions of concern Come in for evaluation if you have skin lesions of concern.  Lung cancer screening: If you have a greater than 20 pack year history of tobacco use, then you may qualify for lung cancer screening with a chest CT scan.   Please call your insurance company to inquire about coverage for this test.  We currently don't have screenings for other cancers besides breast, cervical, colon, and lung cancers.  If you have a strong family history of cancer or have other cancer screening concerns, please let me know.    Bone health:  Get at least 150 minutes of aerobic exercise weekly Get weight bearing exercise at least once weekly Bone density test:  A bone density test is an imaging test that uses a type of X-ray to measure the amount of calcium and other minerals in your bones. The test may be used to diagnose or screen you for a condition that causes weak or thin bones (osteoporosis), predict your risk for a broken bone (fracture), or determine how well your osteoporosis treatment is working. The bone density test is recommended for females 65 and older, or females or males <65 if certain risk factors such as thyroid disease, long term use of steroids such as for asthma or rheumatological issues, vitamin D deficiency, estrogen deficiency, family history of osteoporosis, self or family history of fragility fracture in first degree relative.    Heart health: Get at least 150 minutes of aerobic exercise weekly Limit alcohol It is important to maintain a healthy blood pressure and healthy cholesterol numbers  Heart  disease screening: Screening for heart disease includes screening for blood pressure, fasting lipids, glucose/diabetes screening, BMI height to weight ratio, reviewed of smoking status, physical activity, and diet.    Goals include blood pressure 120/80 or less, maintaining a healthy lipid/cholesterol profile, preventing diabetes or keeping diabetes numbers under good control, not smoking or using tobacco products, exercising most days per week or at least 150 minutes per week of exercise, and eating healthy variety of fruits and vegetables, healthy oils, and avoiding unhealthy food choices like fried food, fast food, high sugar and high cholesterol foods.    Other tests may possibly include EKG test, CT coronary calcium score, echocardiogram, exercise treadmill stress test.    Medical care options: I recommend you continue to seek care here first for routine care.  We try really hard to have available appointments Monday through Friday daytime hours for sick visits, acute visits, and physicals.  Urgent care should be used for after hours and weekends for significant issues that cannot wait till the next day.  The emergency department should be used for significant potentially life-threatening emergencies.  The emergency department is expensive, can often have long wait times for less significant concerns, so try to utilize primary care, urgent care, or telemedicine when possible to avoid unnecessary trips to the emergency department.  Virtual visits and telemedicine have been introduced since the pandemic started in 2020, and can be convenient ways to receive medical care.  We offer virtual appointments as well to assist you in a variety of options to seek medical care.   Separate significant issues discussed: BMI greater than 30-work on efforts to lose weight through healthy diet and exercise.  History of Hepatic steatosis/fatty liver - work on efforts to eat less sugar, less carbs, and losing  weight  History of diverticulitis and prior surgery-doing fine currently  Use to vape but now does nicotine pouches.    Prior impaired glucose - recheck labs today   Molly MaduroRobert was seen today for fasting cpe.  Diagnoses and all orders for this visit:  Encounter for health maintenance examination in adult -     Comprehensive metabolic panel -     Lipid panel -     CBC with Differential/Platelet -     POCT Urinalysis DIP (Proadvantage Device) -     Hemoglobin A1c -     TSH  History of diverticulitis  Low serum HDL -     Lipid panel  Vaccine counseling  Screening for lipid  disorders -     Lipid panel  Screening for diabetes mellitus -     Hemoglobin A1c  Impaired fasting blood sugar -     Hemoglobin A1c  Hepatic steatosis  BMI 37.0-37.9, adult  S/P laparoscopic-assisted sigmoidectomy  Exophthalmos -     TSH  Influenza vaccination declined   Follow-up pending labs, yearly for physical

## 2022-02-04 LAB — CBC WITH DIFFERENTIAL/PLATELET
Basophils Absolute: 0.1 10*3/uL (ref 0.0–0.2)
Basos: 1 %
EOS (ABSOLUTE): 0.2 10*3/uL (ref 0.0–0.4)
Eos: 3 %
Hematocrit: 45.6 % (ref 37.5–51.0)
Hemoglobin: 15.4 g/dL (ref 13.0–17.7)
Immature Grans (Abs): 0.1 10*3/uL (ref 0.0–0.1)
Immature Granulocytes: 1 %
Lymphocytes Absolute: 1.4 10*3/uL (ref 0.7–3.1)
Lymphs: 22 %
MCH: 29.2 pg (ref 26.6–33.0)
MCHC: 33.8 g/dL (ref 31.5–35.7)
MCV: 86 fL (ref 79–97)
Monocytes Absolute: 0.6 10*3/uL (ref 0.1–0.9)
Monocytes: 9 %
Neutrophils Absolute: 4 10*3/uL (ref 1.4–7.0)
Neutrophils: 64 %
Platelets: 244 10*3/uL (ref 150–450)
RBC: 5.28 x10E6/uL (ref 4.14–5.80)
RDW: 13.1 % (ref 11.6–15.4)
WBC: 6.2 10*3/uL (ref 3.4–10.8)

## 2022-02-04 LAB — HEMOGLOBIN A1C
Est. average glucose Bld gHb Est-mCnc: 134 mg/dL
Hgb A1c MFr Bld: 6.3 % — ABNORMAL HIGH (ref 4.8–5.6)

## 2022-02-04 LAB — LIPID PANEL
Chol/HDL Ratio: 4.1 ratio (ref 0.0–5.0)
Cholesterol, Total: 158 mg/dL (ref 100–199)
HDL: 39 mg/dL — ABNORMAL LOW (ref >39)
LDL Chol Calc (NIH): 101 mg/dL — ABNORMAL HIGH (ref 0–99)
Triglycerides: 98 mg/dL (ref 0–149)
VLDL Cholesterol Cal: 18 mg/dL (ref 5–40)

## 2022-02-04 LAB — COMPREHENSIVE METABOLIC PANEL
ALT: 59 IU/L — ABNORMAL HIGH (ref 0–44)
AST: 29 IU/L (ref 0–40)
Albumin/Globulin Ratio: 2.2 (ref 1.2–2.2)
Albumin: 4.7 g/dL (ref 4.1–5.1)
Alkaline Phosphatase: 52 IU/L (ref 44–121)
BUN/Creatinine Ratio: 13 (ref 9–20)
BUN: 13 mg/dL (ref 6–20)
Bilirubin Total: 0.6 mg/dL (ref 0.0–1.2)
CO2: 20 mmol/L (ref 20–29)
Calcium: 9.1 mg/dL (ref 8.7–10.2)
Chloride: 100 mmol/L (ref 96–106)
Creatinine, Ser: 0.99 mg/dL (ref 0.76–1.27)
Globulin, Total: 2.1 g/dL (ref 1.5–4.5)
Glucose: 105 mg/dL — ABNORMAL HIGH (ref 70–99)
Potassium: 4.5 mmol/L (ref 3.5–5.2)
Sodium: 137 mmol/L (ref 134–144)
Total Protein: 6.8 g/dL (ref 6.0–8.5)
eGFR: 101 mL/min/{1.73_m2} (ref >59)

## 2022-02-04 LAB — TSH: TSH: 1.61 u[IU]/mL (ref 0.450–4.500)

## 2022-08-21 ENCOUNTER — Telehealth: Payer: Self-pay | Admitting: Medical

## 2022-08-21 NOTE — Telephone Encounter (Signed)
Hx of diverticulitis    Had a flare up end of last week  ( Wed-Fri) Now he has fever, feeling bad in general, stomach is actually better right now but just the "flu like" symptoms   but no "cold " symptoms  Pt requested nurse call

## 2022-08-21 NOTE — Telephone Encounter (Signed)
Spoke with patient and told him to go to [facility] or ER if having worsening symptoms. Scheduled him for tomorrow.

## 2022-08-22 ENCOUNTER — Telehealth: Payer: Commercial Managed Care - PPO | Admitting: Family Medicine

## 2022-08-22 ENCOUNTER — Encounter: Payer: Self-pay | Admitting: Family Medicine

## 2022-08-22 DIAGNOSIS — U071 COVID-19: Secondary | ICD-10-CM | POA: Diagnosis not present

## 2022-08-22 NOTE — Progress Notes (Signed)
   Subjective:    Patient ID: Carolynne Edouard, male    DOB: [birth year], 37 y.o.   MRN: [id number]  HPI Documentation for virtual audio and video telecommunications through [facility] encounter: The patient was located at home. 2 patient identifiers used.  The provider was located in the office. The patient did consent to this visit and is aware of possible charges through their insurance for this visit. The other persons participating in this telemedicine service were none. Time spent on call was 5 minutes and in review of previous records >15 minutes total for counseling and coordination of care. This virtual service is not related to other E/M service within previous 7 days.  He states that Sunday he developed a slight fever with some rhinorrhea, chills and myalgias.  On Monday he did test and was positive.  He did state that he feels like he might of broken a fever earlier today.  Review of Systems     Objective:   Physical Exam Alert and in no distress.  Does not appear toxic.       Assessment & Plan:  COVID-19 Recommend supportive care with Tylenol for the fever aches and pains cough medicine as needed.  Keep hydrated.  Discussed worsening of symptoms in regard to fever, coughing or shortness of breath.  Recommend isolation for roughly 3 days if not longer based on his fever.  If his fever breaks at that point he is free to move around.  He was comfortable with that.

## 2023-02-16 ENCOUNTER — Ambulatory Visit (INDEPENDENT_AMBULATORY_CARE_PROVIDER_SITE_OTHER): Payer: Commercial Managed Care - PPO | Admitting: Medical

## 2023-02-16 ENCOUNTER — Encounter: Payer: Self-pay | Admitting: Medical

## 2023-02-16 VITALS — BP 120/80 | HR 86 | Ht 73.0 in | Wt 275.0 lb

## 2023-02-16 DIAGNOSIS — E786 Lipoprotein deficiency: Secondary | ICD-10-CM

## 2023-02-16 DIAGNOSIS — R7301 Impaired fasting glucose: Secondary | ICD-10-CM

## 2023-02-16 DIAGNOSIS — Z7185 Encounter for immunization safety counseling: Secondary | ICD-10-CM | POA: Diagnosis not present

## 2023-02-16 DIAGNOSIS — Z23 Encounter for immunization: Secondary | ICD-10-CM

## 2023-02-16 DIAGNOSIS — Z0000 Encounter for general adult medical examination without abnormal findings: Secondary | ICD-10-CM

## 2023-02-16 DIAGNOSIS — Z1329 Encounter for screening for other suspected endocrine disorder: Secondary | ICD-10-CM

## 2023-02-16 LAB — LIPID PANEL

## 2023-02-16 NOTE — Progress Notes (Signed)
Subjective:   HPI  Dean Carter is a 37 y.o. male who presents for Chief Complaint  Patient presents with   Annual Exam    Fasting cpe, no concerns    Patient Care Team: Casanova Schurman, Cleda Mccreedy as PCP - General (Family Medicine) Sees dentist Sees eye doctor Dr. Marin Olp, general surgery Dr. Doristine Locks, GI   Concerns: Lots going on.  Planning to get married 08/2023.  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    Doing fine otherwise.  He and fiance going to gym, trying to get healthier.   Reviewed their medical, surgical, family, social, medication, and allergy history and updated chart as appropriate.  Past Medical History:  Diagnosis Date   Allergy    RHINITIS   Diverticulitis 03/2012   few occurrences as of 05/2015   Farsightedness    wears contacts    Past Surgical History:  Procedure Laterality Date   APPENDECTOMY     COLON SURGERY  06/2020   diverticulitis   FLEXIBLE SIGMOIDOSCOPY N/A 06/23/2020   Procedure: FLEXIBLE SIGMOIDOSCOPY;  Surgeon: Andria Meuse, MD;  Location: [facility];  Service: General;  Laterality: N/A;   LASIK  2022   WISDOM TOOTH EXTRACTION      Family History  Problem Relation Age of Onset   Diabetes Mother        TYPE 2   Arthritis Father        TKR   Heart disease Paternal Uncle        CABG   Stroke Maternal Grandmother    Heart disease Paternal Grandfather        CABG   Cancer Neg Hx        no colon or esophagus    Hypertension Neg Hx    Hyperlipidemia Neg Hx     No current outpatient medications on file.  No Known Allergies   Review of Systems  Constitutional:  Negative for chills, fever, malaise/fatigue and weight loss.  HENT:  Negative for congestion, ear pain, hearing loss, sore throat and tinnitus.   Eyes:  Negative for blurred vision, pain and redness.  Respiratory:  Negative for cough, hemoptysis and shortness of breath.   Cardiovascular:  Negative for chest pain,  palpitations, orthopnea, claudication and leg swelling.  Gastrointestinal:  Negative for abdominal pain, blood in stool, constipation, diarrhea, nausea and vomiting.  Genitourinary:  Negative for dysuria, flank pain, frequency, hematuria and urgency.  Musculoskeletal:  Negative for falls, joint pain and myalgias.  Skin:  Negative for itching and rash.  Neurological:  Negative for dizziness, tingling, speech change, weakness and headaches.  Endo/Heme/Allergies:  Negative for polydipsia. Does not bruise/bleed easily.  Psychiatric/Behavioral:  Negative for depression and memory loss. The patient is not nervous/anxious and does not have insomnia.         02/16/2023    8:25 AM 02/03/2022    8:16 AM 01/21/2021    8:29 AM 10/22/2020    8:35 AM 03/22/2018    9:37 AM  Depression screen PHQ 2/9  Decreased Interest 0 0 0 0 0  Down, Depressed, Hopeless 0 0 0 0 0  PHQ - 2 Score 0 0 0 0 0        Objective:  BP 120/80   Pulse 86   Ht 6\' 1"  (1.854 m)   Wt 275 lb (124.7 kg)   BMI 36.28 kg/m   Wt Readings from Last 3 Encounters:  02/16/23 275 lb (124.7  kg)  02/03/22 283 lb (128.4 kg)  01/21/21 274 lb 6.4 oz (124.5 kg)   BP Readings from Last 3 Encounters:  02/16/23 120/80  02/03/22 116/80  01/21/21 108/82    General appearance: alert, no distress, WD/WN, Caucasian male Skin: unremarkable HEENT: normocephalic, conjunctiva/corneas normal, sclerae anicteric, PERRLA, EOMi Neck: supple, no lymphadenopathy, no thyromegaly, no masses, normal ROM, no bruits Chest: non tender, normal shape and expansion Heart: RRR, normal S1, S2, no murmurs Lungs: CTA bilaterally, no wheezes, rhonchi, or rales Abdomen: +bs, soft, non tender, non distended, no masses, no hepatomegaly, no splenomegaly, no bruits Back: non tender, normal ROM, no scoliosis Musculoskeletal: upper extremities non tender, no obvious deformity, normal ROM throughout, lower extremities non tender, no obvious deformity, normal ROM  throughout Extremities: no edema, no cyanosis, no clubbing Pulses: 2+ symmetric, upper and lower extremities, normal cap refill Neurological: alert, oriented x 3, CN2-12 intact, strength normal upper extremities and lower extremities, sensation normal throughout, DTRs 2+ throughout, no cerebellar signs, gait normal Psychiatric: normal affect, behavior normal, pleasant  GU: deferred Rectal: deferred   Assessment and Plan :   Encounter Diagnoses  Name Primary?   Encounter for health maintenance examination in adult Yes   Need for Tdap vaccination    Vaccine counseling    Impaired fasting blood sugar    Low HDL (under 40)    Screening for thyroid disorder     This visit was a preventative care visit, also known as wellness visit or routine physical.   Topics typically include healthy lifestyle, diet, exercise, preventative care, vaccinations, sick and well care, proper use of emergency dept and after hours care, as well as other concerns.     Recommendations: Continue to return yearly for your annual wellness and preventative care visits.  This gives [place] a chance to discuss healthy lifestyle, exercise, vaccinations, review your chart record, and perform screenings where appropriate.  I recommend you see your eye doctor yearly for routine vision care.  I recommend you see your dentist yearly for routine dental care including hygiene visits twice yearly.   Vaccination recommendations were reviewed Immunization History  Administered Date(s) Administered   Influenza,inj,Quad PF,6+ Mos 12/06/2012   Moderna Sars-Covid-2 Vaccination 05/22/2019, 06/19/2019   Tdap 11/01/2012   You decline influenza vaccine  Counseled on the Tdap (tetanus, diptheria, and acellular pertussis) vaccine.  Vaccine information sheet given. Tdap vaccine given after consent obtained.    Screening for cancer: Colon cancer screening: Up to date per GI 2020. Repeat age 51  Testicular cancer screening You  should do a monthly self testicular exam if you are between 58-47 years old  We discussed PSA, prostate exam, and prostate cancer screening risks/benefits.   Age 20  Skin cancer screening: Check your skin regularly for new changes, growing lesions, or other lesions of concern Come in for evaluation if you have skin lesions of concern.  Lung cancer screening: If you have a greater than 20 pack year history of tobacco use, then you may qualify for lung cancer screening with a chest CT scan.   Please call your insurance company to inquire about coverage for this test.  We currently don't have screenings for other cancers besides breast, cervical, colon, and lung cancers.  If you have a strong family history of cancer or have other cancer screening concerns, please let me know.    Bone health: Get at least 150 minutes of aerobic exercise weekly Get weight bearing exercise at least once weekly Bone density test:  A bone density test is an imaging test that uses a type of X-ray to measure the amount of calcium and other minerals in your bones. The test may be used to diagnose or screen you for a condition that causes weak or thin bones (osteoporosis), predict your risk for a broken bone (fracture), or determine how well your osteoporosis treatment is working. The bone density test is recommended for females 65 and older, or females or males <65 if certain risk factors such as thyroid disease, long term use of steroids such as for asthma or rheumatological issues, vitamin D deficiency, estrogen deficiency, family history of osteoporosis, self or family history of fragility fracture in first degree relative.    Heart health: Get at least 150 minutes of aerobic exercise weekly Limit alcohol It is important to maintain a healthy blood pressure and healthy cholesterol numbers  Heart disease screening: Screening for heart disease includes screening for blood pressure, fasting lipids,  glucose/diabetes screening, BMI height to weight ratio, reviewed of smoking status, physical activity, and diet.    Goals include blood pressure 120/80 or less, maintaining a healthy lipid/cholesterol profile, preventing diabetes or keeping diabetes numbers under good control, not smoking or using tobacco products, exercising most days per week or at least 150 minutes per week of exercise, and eating healthy variety of fruits and vegetables, healthy oils, and avoiding unhealthy food choices like fried food, fast food, high sugar and high cholesterol foods.    Other tests may possibly include EKG test, CT coronary calcium score, echocardiogram, exercise treadmill stress test.    Medical care options: I recommend you continue to seek care here first for routine care.  We try really hard to have available appointments Monday through Friday daytime hours for sick visits, acute visits, and physicals.  Urgent care should be used for after hours and weekends for significant issues that cannot wait till the next day.  The emergency department should be used for significant potentially life-threatening emergencies.  The emergency department is expensive, can often have long wait times for less significant concerns, so try to utilize primary care, urgent care, or telemedicine when possible to avoid unnecessary trips to the emergency department.  Virtual visits and telemedicine have been introduced since the pandemic started in 2020, and can be convenient ways to receive medical care.  We offer virtual appointments as well to assist you in a variety of options to seek medical care.   Separate significant issues discussed: BMI greater than 30 Work on efforts to lose weight through healthy lifestyle, healthy eating habits, and regular exercise.  They are committed to these efforts.    Consider specific nutrition strategy using a plan such as 1500 calorie per day diet , tracking calories on a fitness app on the  phone, or Weight Watchers program, [employer], or Whole 30 plan. Get moderate intensity exercise at least 150 minutes per week, such as 4-5 days per week, 45-60 minutes each.    History of diverticulitis and prior surgery-doing fine currently  Prior impaired glucose - recheck labs today   Brunson was seen today for annual exam.  Diagnoses and all orders for this visit:  Encounter for health maintenance examination in adult -     Comprehensive metabolic panel -     CBC -     Lipid panel -     Hemoglobin A1c -     TSH  Need for Tdap vaccination  Vaccine counseling  Impaired fasting blood sugar -  Hemoglobin A1c  Low HDL (under 40) -     Lipid panel  Screening for thyroid disorder -     TSH   Follow-up pending labs, yearly for physical

## 2023-02-16 NOTE — Addendum Note (Signed)
Addended by: Herminio Commons A on: 02/16/2023 08:54 AM   Modules accepted: Orders

## 2023-02-16 NOTE — Patient Instructions (Signed)
This visit was a preventative care visit, also known as wellness visit or routine physical.   Topics typically include healthy lifestyle, diet, exercise, preventative care, vaccinations, sick and well care, proper use of emergency dept and after hours care, as well as other concerns.     Recommendations: Continue to return yearly for your annual wellness and preventative care visits.  This gives [place] a chance to discuss healthy lifestyle, exercise, vaccinations, review your chart record, and perform screenings where appropriate.  I recommend you see your eye doctor yearly for routine vision care.  I recommend you see your dentist yearly for routine dental care including hygiene visits twice yearly.   Vaccination recommendations were reviewed Immunization History  Administered Date(s) Administered   Influenza,inj,Quad PF,6+ Mos 12/06/2012   Moderna Sars-Covid-2 Vaccination 05/22/2019, 06/19/2019   Tdap 11/01/2012   You decline influenza vaccine  Counseled on the Tdap (tetanus, diptheria, and acellular pertussis) vaccine.  Vaccine information sheet given. Tdap vaccine given after consent obtained.    Screening for cancer: Colon cancer screening: Up to date per GI 2020. Repeat age 38  Testicular cancer screening You should do a monthly self testicular exam if you are between 38-4 years old  We discussed PSA, prostate exam, and prostate cancer screening risks/benefits.   Age 37  Skin cancer screening: Check your skin regularly for new changes, growing lesions, or other lesions of concern Come in for evaluation if you have skin lesions of concern.  Lung cancer screening: If you have a greater than 20 pack year history of tobacco use, then you may qualify for lung cancer screening with a chest CT scan.   Please call your insurance company to inquire about coverage for this test.  We currently don't have screenings for other cancers besides breast, cervical, colon, and lung cancers.   If you have a strong family history of cancer or have other cancer screening concerns, please let me know.    Bone health: Get at least 150 minutes of aerobic exercise weekly Get weight bearing exercise at least once weekly Bone density test:  A bone density test is an imaging test that uses a type of X-ray to measure the amount of calcium and other minerals in your bones. The test may be used to diagnose or screen you for a condition that causes weak or thin bones (osteoporosis), predict your risk for a broken bone (fracture), or determine how well your osteoporosis treatment is working. The bone density test is recommended for females 65 and older, or females or males <65 if certain risk factors such as thyroid disease, long term use of steroids such as for asthma or rheumatological issues, vitamin D deficiency, estrogen deficiency, family history of osteoporosis, self or family history of fragility fracture in first degree relative.    Heart health: Get at least 150 minutes of aerobic exercise weekly Limit alcohol It is important to maintain a healthy blood pressure and healthy cholesterol numbers  Heart disease screening: Screening for heart disease includes screening for blood pressure, fasting lipids, glucose/diabetes screening, BMI height to weight ratio, reviewed of smoking status, physical activity, and diet.    Goals include blood pressure 120/80 or less, maintaining a healthy lipid/cholesterol profile, preventing diabetes or keeping diabetes numbers under good control, not smoking or using tobacco products, exercising most days per week or at least 150 minutes per week of exercise, and eating healthy variety of fruits and vegetables, healthy oils, and avoiding unhealthy food choices like fried food, fast food,  high sugar and high cholesterol foods.    Other tests may possibly include EKG test, CT coronary calcium score, echocardiogram, exercise treadmill stress test.    Medical  care options: I recommend you continue to seek care here first for routine care.  We try really hard to have available appointments Monday through Friday daytime hours for sick visits, acute visits, and physicals.  Urgent care should be used for after hours and weekends for significant issues that cannot wait till the next day.  The emergency department should be used for significant potentially life-threatening emergencies.  The emergency department is expensive, can often have long wait times for less significant concerns, so try to utilize primary care, urgent care, or telemedicine when possible to avoid unnecessary trips to the emergency department.  Virtual visits and telemedicine have been introduced since the pandemic started in 2020, and can be convenient ways to receive medical care.  We offer virtual appointments as well to assist you in a variety of options to seek medical care.   Separate significant issues discussed: BMI greater than 30 Work on efforts to lose weight through healthy lifestyle, healthy eating habits, and regular exercise.  They are committed to these efforts.    Consider specific nutrition strategy using a plan such as 1500 calorie per day diet , tracking calories on a fitness app on the phone, or Weight Watchers program, [employer], or Whole 30 plan. Get moderate intensity exercise at least 150 minutes per week, such as 4-5 days per week, 45-60 minutes each.    History of diverticulitis and prior surgery-doing fine currently  Prior impaired glucose - recheck labs today

## 2023-02-17 LAB — LIPID PANEL
Cholesterol, Total: 194 mg/dL (ref 100–199)
HDL: 40 mg/dL (ref >39)
LDL CALC COMMENT:: 4.9 ratio (ref 0.0–5.0)
LDL Chol Calc (NIH): 139 mg/dL — ABNORMAL HIGH (ref 0–99)
Triglycerides: 84 mg/dL (ref 0–149)
VLDL Cholesterol Cal: 15 mg/dL (ref 5–40)

## 2023-02-17 LAB — CBC
Hematocrit: 48.4 % (ref 37.5–51.0)
Hemoglobin: 16.2 g/dL (ref 13.0–17.7)
MCH: 29.2 pg (ref 26.6–33.0)
MCHC: 33.5 g/dL (ref 31.5–35.7)
MCV: 87 fL (ref 79–97)
Platelets: 257 10*3/uL (ref 150–450)
RBC: 5.55 x10E6/uL (ref 4.14–5.80)
RDW: 12.8 % (ref 11.6–15.4)
WBC: 8.1 10*3/uL (ref 3.4–10.8)

## 2023-02-17 LAB — COMPREHENSIVE METABOLIC PANEL
ALT: 42 IU/L (ref 0–44)
AST: 22 IU/L (ref 0–40)
Albumin: 4.8 g/dL (ref 4.1–5.1)
Alkaline Phosphatase: 51 IU/L (ref 44–121)
BUN/Creatinine Ratio: 17 (ref 9–20)
BUN: 18 mg/dL (ref 6–20)
Bilirubin Total: 0.5 mg/dL (ref 0.0–1.2)
CO2: 22 mmol/L (ref 20–29)
Calcium: 9.5 mg/dL (ref 8.7–10.2)
Chloride: 101 mmol/L (ref 96–106)
Creatinine, Ser: 1.05 mg/dL (ref 0.76–1.27)
Globulin, Total: 2 g/dL (ref 1.5–4.5)
Glucose: 105 mg/dL — ABNORMAL HIGH (ref 70–99)
Potassium: 4.7 mmol/L (ref 3.5–5.2)
Sodium: 137 mmol/L (ref 134–144)
Total Protein: 6.8 g/dL (ref 6.0–8.5)
eGFR: 93 mL/min/{1.73_m2} (ref >59)

## 2023-02-17 LAB — HEMOGLOBIN A1C
Est. average glucose Bld gHb Est-mCnc: 140 mg/dL
Hgb A1c MFr Bld: 6.5 % — ABNORMAL HIGH (ref 4.8–5.6)

## 2023-02-17 LAB — TSH: TSH: 1.19 u[IU]/mL (ref 0.450–4.500)

## 2023-02-19 NOTE — Progress Notes (Signed)
Results sent through MyChart

## 2024-02-07 ENCOUNTER — Encounter: Payer: Commercial Managed Care - PPO | Admitting: Medical

## 2024-02-07 ENCOUNTER — Telehealth: Payer: Self-pay | Admitting: Internal Medicine

## 2024-02-07 NOTE — Telephone Encounter (Signed)
 This patient no showed for their CPE appointment today.Which of the following is necessary for this patient.   A) No follow-up necessary   B) Follow-up urgent. Locate Patient Immediately.   C) Follow-up necessary. Contact patient and Schedule visit in ____ Days.   D) Follow-up Advised. Contact patient and Schedule visit in ____ Days.  E) Please Send no show letter to patient. Charge no show fee if no show was a CPE.

## 2024-02-22 ENCOUNTER — Encounter: Payer: Self-pay | Admitting: Medical
